# Patient Record
Sex: Female | Born: 1990
Health system: Southern US, Community
[De-identification: ages and names within clinical notes are randomized; demographics above are authoritative.]

## PROBLEM LIST (undated history)

## (undated) DIAGNOSIS — B019 Varicella without complication: Secondary | ICD-10-CM

## (undated) DIAGNOSIS — R112 Nausea with vomiting, unspecified: Secondary | ICD-10-CM

## (undated) DIAGNOSIS — Z9889 Other specified postprocedural states: Secondary | ICD-10-CM

## (undated) DIAGNOSIS — F419 Anxiety disorder, unspecified: Secondary | ICD-10-CM

## (undated) DIAGNOSIS — I10 Essential (primary) hypertension: Secondary | ICD-10-CM

## (undated) DIAGNOSIS — T7840XA Allergy, unspecified, initial encounter: Secondary | ICD-10-CM

## (undated) HISTORY — DX: Anxiety disorder, unspecified: F41.9

## (undated) HISTORY — DX: Essential (primary) hypertension: I10

## (undated) HISTORY — DX: Varicella without complication: B01.9

## (undated) HISTORY — PX: NOSE SURGERY: SHX723

## (undated) HISTORY — PX: COSMETIC SURGERY: SHX468

## (undated) HISTORY — DX: Allergy, unspecified, initial encounter: T78.40XA

---
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
## 2011-08-09 LAB — HM PAP SMEAR

## 2013-08-05 ENCOUNTER — Telehealth: Payer: Self-pay

## 2013-08-05 NOTE — Telephone Encounter (Signed)
Left message for call back Non-identifiable   NEW CLIENT

## 2013-08-08 ENCOUNTER — Encounter: Payer: Self-pay | Admitting: Family Medicine

## 2013-08-08 ENCOUNTER — Ambulatory Visit (INDEPENDENT_AMBULATORY_CARE_PROVIDER_SITE_OTHER): Payer: 59 | Admitting: Family Medicine

## 2013-08-08 VITALS — BP 108/70 | HR 71 | Temp 98.8°F | Ht 65.25 in | Wt 192.2 lb

## 2013-08-08 DIAGNOSIS — E669 Obesity, unspecified: Secondary | ICD-10-CM | POA: Insufficient documentation

## 2013-08-08 DIAGNOSIS — F411 Generalized anxiety disorder: Secondary | ICD-10-CM

## 2013-08-08 NOTE — Patient Instructions (Signed)

## 2013-08-08 NOTE — Progress Notes (Signed)
  Subjective:     Susan FredricksonHayley Craig is a 23 y.o. female who presents for follow up of anxiety disorder. Current symptoms: none. She denies current suicidal and homicidal ideation. She complains of the following side effects from the treatment: none.  The following portions of the patient's history were reviewed and updated as appropriate: allergies, current medications, past family history, past medical history, past social history, past surgical history and problem list.    Past Medical History  Diagnosis Date  . Chicken pox   . Anxiety    Family History  Problem Relation Age of Onset  . Arthritis Paternal Grandmother   . Depression Paternal Grandmother   . Cancer Paternal Grandmother     pancreatic  . Lung cancer Maternal Grandmother     Smoker  . High blood pressure Paternal Grandfather   . Heart disease Paternal Grandfather     a fib-- cabg  . Depression Father   . Anxiety disorder Mother   . Anxiety disorder Sister   . COPD Maternal Grandfather    History   Social History  . Marital Status: Unknown    Spouse Name: N/A    Number of Children: N/A  . Years of Education: N/A   Occupational History  . nurse [employer]   Social History Main Topics  . Smoking status: Never Smoker   . Smokeless tobacco: Never Used  . Alcohol Use: Yes     Comment: Occasionally  . Drug Use: No  . Sexual Activity: Yes    Partners: Male    Birth Control/ Protection: Pill   Other Topics Concern  . Not on file   Social History Narrative  . No narrative on file   Current Outpatient Prescriptions  Medication Sig Dispense Refill  . levonorgestrel-ethinyl estradiol (AVIANE,ALESSE,LESSINA) 0.1-20 MG-MCG tablet Take 1 tablet by mouth daily.      Marland Kitchen. PARoxetine (PAXIL) 20 MG tablet Take 20 mg by mouth daily.       No current facility-administered medications for this visit.    Objective:    BP 108/70  Pulse 71  Temp(Src) 98.8 F (37.1 C) (Oral)  Ht 5' 5.25" (1.657 m)  Wt 192 lb 3.2 oz  (87.181 kg)  BMI 31.75 kg/m2  SpO2 98%  LMP 07/27/2013  General:  alert, cooperative, appears stated age and no distress  Affect/Behavior:  full facial expressions, good grooming, good insight, normal perception, normal reasoning, normal speech pattern and content and normal thought patterns no abnormal findings      Assessment:    Anxiety Disorder - improving    Plan:    Medications: Paxil. Handouts describing disease, natural history, and treatment were given to the patient. Follow up: 1 year or sooner prn.

## 2013-08-08 NOTE — Progress Notes (Signed)
Pre visit review using our clinic review tool, if applicable. No additional management support is needed unless otherwise documented below in the visit note. 

## 2013-08-10 NOTE — Telephone Encounter (Signed)
Unable to reach pre visit.  

## 2013-08-19 ENCOUNTER — Telehealth: Payer: Self-pay | Admitting: Family Medicine

## 2013-08-19 MED ORDER — PAROXETINE HCL 20 MG PO TABS: 20.0000 mg | ORAL_TABLET | Freq: Every day | ORAL | Status: DC

## 2013-08-19 NOTE — Telephone Encounter (Signed)
Rx sent---- Msg left on VM advising the patient  KP

## 2013-08-19 NOTE — Telephone Encounter (Signed)
Patient called and requested a refill for PARoxetine (PAXIL) 20 MG tablet Pharmacy [facility] out patient [facility]

## 2014-06-22 ENCOUNTER — Ambulatory Visit: Payer: 59 | Admitting: Family Medicine

## 2014-06-23 ENCOUNTER — Other Ambulatory Visit: Payer: Self-pay

## 2014-06-23 ENCOUNTER — Ambulatory Visit (INDEPENDENT_AMBULATORY_CARE_PROVIDER_SITE_OTHER): Payer: 59 | Admitting: Family Medicine

## 2014-06-23 DIAGNOSIS — F419 Anxiety disorder, unspecified: Secondary | ICD-10-CM

## 2014-06-23 MED ORDER — LEVONORGESTREL-ETHINYL ESTRAD 0.1-20 MG-MCG PO TABS: 1.0000 | ORAL_TABLET | Freq: Every day | ORAL | Status: DC

## 2014-06-23 NOTE — Progress Notes (Signed)
Pre visit review using our clinic review tool, if applicable. No additional management support is needed unless otherwise documented below in the visit.  

## 2014-08-09 ENCOUNTER — Ambulatory Visit: Payer: 59 | Admitting: Family Medicine

## 2014-09-07 ENCOUNTER — Other Ambulatory Visit: Payer: Self-pay | Admitting: Family Medicine

## 2014-09-07 MED ORDER — PAROXETINE HCL 20 MG PO TABS: 20.0000 mg | ORAL_TABLET | Freq: Every day | ORAL | Status: DC

## 2014-09-07 MED ORDER — LEVONORGESTREL-ETHINYL ESTRAD 0.1-20 MG-MCG PO TABS: 1.0000 | ORAL_TABLET | Freq: Every day | ORAL | Status: DC

## 2014-09-07 NOTE — Addendum Note (Signed)
Addended by: Arnette NorrisPAYNE, Oshae Simmering P on: 09/07/2014 08:21 AM   Modules accepted: Orders

## 2014-09-12 ENCOUNTER — Other Ambulatory Visit (HOSPITAL_COMMUNITY)
Admission: RE | Admit: 2014-09-12 | Discharge: 2014-09-12 | Disposition: A | Discharge status: Home / Self Care | Payer: 59 | Source: Ambulatory Visit | Attending: Family Medicine | Admitting: Family Medicine

## 2014-09-12 ENCOUNTER — Encounter: Payer: Self-pay | Admitting: Family Medicine

## 2014-09-12 ENCOUNTER — Ambulatory Visit (INDEPENDENT_AMBULATORY_CARE_PROVIDER_SITE_OTHER): Payer: 59 | Admitting: Family Medicine

## 2014-09-12 VITALS — HR 80 | Temp 97.9°F | Ht 65.0 in | Wt 202.4 lb

## 2014-09-12 DIAGNOSIS — Z0000 Encounter for general adult medical examination without abnormal findings: Secondary | ICD-10-CM

## 2014-09-12 DIAGNOSIS — Z01419 Encounter for gynecological examination (general) (routine) without abnormal findings: Secondary | ICD-10-CM | POA: Insufficient documentation

## 2014-09-12 LAB — CBC WITH DIFFERENTIAL/PLATELET
BASOS ABS: 0 10*3/uL (ref 0.0–0.1)
BASOS PCT: 0.3 % (ref 0.0–3.0)
Eosinophils Absolute: 0.1 10*3/uL (ref 0.0–0.7)
Eosinophils Relative: 0.8 % (ref 0.0–5.0)
HCT: 41.6 % (ref 36.0–46.0)
Hemoglobin: 14.3 g/dL (ref 12.0–15.0)
LYMPHS PCT: 32 % (ref 12.0–46.0)
Lymphs Abs: 2.9 10*3/uL (ref 0.7–4.0)
MCHC: 34.5 g/dL (ref 30.0–36.0)
MCV: 90.6 fl (ref 78.0–100.0)
Monocytes Absolute: 0.5 10*3/uL (ref 0.1–1.0)
Monocytes Relative: 5.7 % (ref 3.0–12.0)
NEUTROS PCT: 61.2 % (ref 43.0–77.0)
Neutro Abs: 5.5 10*3/uL (ref 1.4–7.7)
PLATELETS: 365 10*3/uL (ref 150.0–400.0)
RBC: 4.59 Mil/uL (ref 3.87–5.11)
RDW: 12.6 % (ref 11.5–15.5)
WBC: 8.9 10*3/uL (ref 4.0–10.5)

## 2014-09-12 LAB — POCT URINALYSIS DIPSTICK
Bilirubin, UA: NEGATIVE
Blood, UA: NEGATIVE
GLUCOSE UA: NEGATIVE
KETONES UA: NEGATIVE
Leukocytes, UA: NEGATIVE
Nitrite, UA: NEGATIVE
PROTEIN UA: NEGATIVE
SPEC GRAV UA: 1.025
Urobilinogen, UA: 4
pH, UA: 6

## 2014-09-12 LAB — BASIC METABOLIC PANEL
BUN: 13 mg/dL (ref 6–23)
CALCIUM: 9.7 mg/dL (ref 8.4–10.5)
CO2: 25 mEq/L (ref 19–32)
Chloride: 106 mEq/L (ref 96–112)
Creatinine, Ser: 0.81 mg/dL (ref 0.40–1.20)
GFR: 92.07 mL/min (ref >60.00)
Glucose, Bld: 88 mg/dL (ref 70–99)
POTASSIUM: 3.7 meq/L (ref 3.5–5.1)
Sodium: 138 mEq/L (ref 135–145)

## 2014-09-12 LAB — LIPID PANEL
Cholesterol: 194 mg/dL (ref 0–200)
HDL: 56.1 mg/dL (ref >39.00)
LDL Cholesterol: 116 mg/dL — ABNORMAL HIGH (ref 0–99)
NonHDL: 137.9
TRIGLYCERIDES: 108 mg/dL (ref 0.0–149.0)
Total CHOL/HDL Ratio: 3
VLDL: 21.6 mg/dL (ref 0.0–40.0)

## 2014-09-12 LAB — TSH: TSH: 1.17 u[IU]/mL (ref 0.35–4.50)

## 2014-09-12 NOTE — Progress Notes (Signed)
Subjective:     Susan Craig is a 24 y.o. female and is here for a comprehensive physical exam. The patient reports no problems.  History   Social History  . Marital Status: Single    Spouse Name: N/A  . Number of Children: N/A  . Years of Education: N/A   Occupational History  . nurse [employer]   Social History Main Topics  . Smoking status: Never Smoker   . Smokeless tobacco: Never Used  . Alcohol Use: Yes     Comment: Occasionally  . Drug Use: No  . Sexual Activity:    Partners: Male    Birth Control/ Protection: Pill   Other Topics Concern  . Not on file   Social History Narrative   Health Maintenance  Topic Date Due  . PAP SMEAR  08/09/2014  . HIV Screening  09/12/2015 (Originally 05/14/2005)  . INFLUENZA VACCINE  12/11/2014  . TETANUS/TDAP  12/11/2022    The following portions of the patient's history were reviewed and updated as appropriate:  She  has a past medical history of Chicken pox and Anxiety. She  does not have any pertinent problems on file. She  has past surgical history that includes Tonsillectomy (2009) and Nose surgery. Her family history includes Anxiety disorder in her mother and sister; Arthritis in her paternal grandmother; COPD in her maternal grandfather; Cancer in her paternal grandmother; Depression in her father and paternal grandmother; Heart disease in her paternal grandfather; High blood pressure in her paternal grandfather; Lung cancer in her maternal grandmother. She  reports that she has never smoked. She has never used smokeless tobacco. She reports that she drinks alcohol. She reports that she does not use illicit drugs. She has a current medication list which includes the following prescription(s): levonorgestrel-ethinyl estradiol and paroxetine. Current Outpatient Prescriptions on File Prior to Visit  Medication Sig Dispense Refill  . levonorgestrel-ethinyl estradiol (AVIANE,ALESSE,LESSINA) 0.1-20 MG-MCG tablet Take 1 tablet  by mouth daily. 3 Package 4  . PARoxetine (PAXIL) 20 MG tablet Take 1 tablet (20 mg total) by mouth daily. 90 tablet 1   No current facility-administered medications on file prior to visit.   She has No Known Allergies..  Review of Systems Review of Systems  Constitutional: Negative for activity change, appetite change and fatigue.  HENT: Negative for hearing loss, congestion, tinnitus and ear discharge.  dentist [id number] Eyes: Negative for visual disturbance (see optho-- due_ Respiratory: Negative for cough, chest tightness and shortness of breath.   Cardiovascular: Negative for chest pain, palpitations and leg swelling.  Gastrointestinal: Negative for abdominal pain, diarrhea, constipation and abdominal distention.  Genitourinary: Negative for urgency, frequency, decreased urine volume and difficulty urinating.  Musculoskeletal: Negative for back pain, arthralgias and gait problem.  Skin: Negative for color change, pallor and rash.  Neurological: Negative for dizziness, light-headedness, numbness and headaches.  Hematological: Negative for adenopathy. Does not bruise/bleed easily.  Psychiatric/Behavioral: Negative for suicidal ideas, confusion, sleep disturbance, self-injury, dysphoric mood, decreased concentration and agitation.       Objective:    Pulse 80  Temp(Src) 97.9 F (36.6 C) (Oral)  Ht 5\' 5"  (1.651 m)  Wt 202 lb 6.4 oz (91.808 kg)  BMI 33.68 kg/m2  SpO2 98%  LMP 08/13/2014 General appearance: alert, cooperative, appears stated age and no distress Head: Normocephalic, without obvious abnormality, atraumatic Eyes: conjunctivae/corneas clear. PERRL, EOM's intact. Fundi benign. Ears: normal TM's and external ear canals both ears Nose: Nares normal. Septum midline. Mucosa normal. No drainage or  sinus tenderness. Throat: lips, mucosa, and tongue normal; teeth and gums normal Neck: no adenopathy, no carotid bruit, no JVD, supple, symmetrical, trachea midline and thyroid not  enlarged, symmetric, no tenderness/mass/nodules Back: symmetric, no curvature. ROM normal. No CVA tenderness. Lungs: clear to auscultation bilaterally Breasts: normal appearance, no masses or tenderness Heart: regular rate and rhythm, S1, S2 normal, no murmur, click, rub or gallop Abdomen: soft, non-tender; bowel sounds normal; no masses,  no organomegaly Pelvic: cervix normal in appearance, external genitalia normal, no adnexal masses or tenderness, no cervical motion tenderness, rectovaginal septum normal, uterus normal size, shape, and consistency, vagina normal without discharge and pap done Extremities: extremities normal, atraumatic, no cyanosis or edema Pulses: 2+ and symmetric Skin: Skin color, texture, turgor normal. No rashes or lesions Lymph nodes: Cervical, supraclavicular, and axillary nodes normal. Neurologic: Alert and oriented X 3, normal strength and tone. Normal symmetric reflexes. Normal coordination and gait Psych- no depression, no anxiety      Assessment:    Healthy female exam.     Plan:    check labs  Start PNV Consider weaning off paxil See After Visit Summary for Counseling Recommendations   1. Preventative health care  - Cytology - PAP - Basic Metabolic Panel (BMET) - TSH - CBC with Differential - Lipid Profile - POCT Urinalysis Dipstick

## 2014-09-12 NOTE — Progress Notes (Signed)
Pre visit review using our clinic review tool, if applicable. No additional management support is needed unless otherwise documented below in the visit note. 

## 2014-09-12 NOTE — Patient Instructions (Signed)
Preventive Care for Adults A healthy lifestyle and preventive care can promote health and wellness. Preventive health guidelines for women include the following key practices.  A routine yearly physical is a good way to check with your health care provider about your health and preventive screening. It is a chance to share any concerns and updates on your health and to receive a thorough exam.  Visit your dentist for a routine exam and preventive care every 6 months. Brush your teeth twice a day and floss once a day. Good oral hygiene prevents tooth decay and gum disease.  The frequency of eye exams is based on your age, health, family medical history, use of contact lenses, and other factors. Follow your health care provider's recommendations for frequency of eye exams.  Eat a healthy diet. Foods like vegetables, fruits, whole grains, low-fat dairy products, and lean protein foods contain the nutrients you need without too many calories. Decrease your intake of foods high in solid fats, added sugars, and salt. Eat the right amount of calories for you.Get information about a proper diet from your health care provider, if necessary.  Regular physical exercise is one of the most important things you can do for your health. Most adults should get at least 150 minutes of moderate-intensity exercise (any activity that increases your heart rate and causes you to sweat) each week. In addition, most adults need muscle-strengthening exercises on 2 or more days a week.  Maintain a healthy weight. The body mass index (BMI) is a screening tool to identify possible weight problems. It provides an estimate of body fat based on height and weight. Your health care provider can find your BMI and can help you achieve or maintain a healthy weight.For adults 20 years and older:  A BMI below 18.5 is considered underweight.  A BMI of 18.5 to 24.9 is normal.  A BMI of 25 to 29.9 is considered overweight.  A BMI of  30 and above is considered obese.  Maintain normal blood lipids and cholesterol levels by exercising and minimizing your intake of saturated fat. Eat a balanced diet with plenty of fruit and vegetables. Blood tests for lipids and cholesterol should begin at age 76 and be repeated every 5 years. If your lipid or cholesterol levels are high, you are over 50, or you are at high risk for heart disease, you may need your cholesterol levels checked more frequently.Ongoing high lipid and cholesterol levels should be treated with medicines if diet and exercise are not working.  If you smoke, find out from your health care provider how to quit. If you do not use tobacco, do not start.  Lung cancer screening is recommended for adults aged 22-80 years who are at high risk for developing lung cancer because of a history of smoking. A yearly low-dose CT scan of the lungs is recommended for people who have at least a 30-pack-year history of smoking and are a current smoker or have quit within the past 15 years. A pack year of smoking is smoking an average of 1 pack of cigarettes a day for 1 year (for example: 1 pack a day for 30 years or 2 packs a day for 15 years). Yearly screening should continue until the smoker has stopped smoking for at least 15 years. Yearly screening should be stopped for people who develop a health problem that would prevent them from having lung cancer treatment.  If you are pregnant, do not drink alcohol. If you are breastfeeding,  be very cautious about drinking alcohol. If you are not pregnant and choose to drink alcohol, do not have more than 1 drink per day. One drink is considered to be 12 ounces (355 mL) of beer, 5 ounces (148 mL) of wine, or 1.5 ounces (44 mL) of liquor.  Avoid use of street drugs. Do not share needles with anyone. Ask for help if you need support or instructions about stopping the use of drugs.  High blood pressure causes heart disease and increases the risk of  stroke. Your blood pressure should be checked at least every 1 to 2 years. Ongoing high blood pressure should be treated with medicines if weight loss and exercise do not work.  If you are 75-52 years old, ask your health care provider if you should take aspirin to prevent strokes.  Diabetes screening involves taking a blood sample to check your fasting blood sugar level. This should be done once every 3 years, after age 15, if you are within normal weight and without risk factors for diabetes. Testing should be considered at a younger age or be carried out more frequently if you are overweight and have at least 1 risk factor for diabetes.  Breast cancer screening is essential preventive care for women. You should practice "breast self-awareness." This means understanding the normal appearance and feel of your breasts and may include breast self-examination. Any changes detected, no matter how small, should be reported to a health care provider. Women in their 58s and 30s should have a clinical breast exam (CBE) by a health care provider as part of a regular health exam every 1 to 3 years. After age 16, women should have a CBE every year. Starting at age 53, women should consider having a mammogram (breast X-ray test) every year. Women who have a family history of breast cancer should talk to their health care provider about genetic screening. Women at a high risk of breast cancer should talk to their health care providers about having an MRI and a mammogram every year.  Breast cancer gene (BRCA)-related cancer risk assessment is recommended for women who have family members with BRCA-related cancers. BRCA-related cancers include breast, ovarian, tubal, and peritoneal cancers. Having family members with these cancers may be associated with an increased risk for harmful changes (mutations) in the breast cancer genes BRCA1 and BRCA2. Results of the assessment will determine the need for genetic counseling and  BRCA1 and BRCA2 testing.  Routine pelvic exams to screen for cancer are no longer recommended for nonpregnant women who are considered low risk for cancer of the pelvic organs (ovaries, uterus, and vagina) and who do not have symptoms. Ask your health care provider if a screening pelvic exam is right for you.  If you have had past treatment for cervical cancer or a condition that could lead to cancer, you need Pap tests and screening for cancer for at least 20 years after your treatment. If Pap tests have been discontinued, your risk factors (such as having a new sexual partner) need to be reassessed to determine if screening should be resumed. Some women have medical problems that increase the chance of getting cervical cancer. In these cases, your health care provider may recommend more frequent screening and Pap tests.  The HPV test is an additional test that may be used for cervical cancer screening. The HPV test looks for the virus that can cause the cell changes on the cervix. The cells collected during the Pap test can be  tested for HPV. The HPV test could be used to screen women aged 30 years and older, and should be used in women of any age who have unclear Pap test results. After the age of 30, women should have HPV testing at the same frequency as a Pap test.  Colorectal cancer can be detected and often prevented. Most routine colorectal cancer screening begins at the age of 50 years and continues through age 75 years. However, your health care provider may recommend screening at an earlier age if you have risk factors for colon cancer. On a yearly basis, your health care provider may provide home test kits to check for hidden blood in the stool. Use of a small camera at the end of a tube, to directly examine the colon (sigmoidoscopy or colonoscopy), can detect the earliest forms of colorectal cancer. Talk to your health care provider about this at age 50, when routine screening begins. Direct  exam of the colon should be repeated every 5-10 years through age 75 years, unless early forms of pre-cancerous polyps or small growths are found.  People who are at an increased risk for hepatitis B should be screened for this virus. You are considered at high risk for hepatitis B if:  You were born in a country where hepatitis B occurs often. Talk with your health care provider about which countries are considered high risk.  Your parents were born in a high-risk country and you have not received a shot to protect against hepatitis B (hepatitis B vaccine).  You have HIV or AIDS.  You use needles to inject street drugs.  You live with, or have sex with, someone who has hepatitis B.  You get hemodialysis treatment.  You take certain medicines for conditions like cancer, organ transplantation, and autoimmune conditions.  Hepatitis C blood testing is recommended for all people born from 1945 through 1965 and any individual with known risks for hepatitis C.  Practice safe sex. Use condoms and avoid high-risk sexual practices to reduce the spread of sexually transmitted infections (STIs). STIs include gonorrhea, chlamydia, syphilis, trichomonas, herpes, HPV, and human immunodeficiency virus (HIV). Herpes, HIV, and HPV are viral illnesses that have no cure. They can result in disability, cancer, and death.  You should be screened for sexually transmitted illnesses (STIs) including gonorrhea and chlamydia if:  You are sexually active and are younger than 24 years.  You are older than 24 years and your health care provider tells you that you are at risk for this type of infection.  Your sexual activity has changed since you were last screened and you are at an increased risk for chlamydia or gonorrhea. Ask your health care provider if you are at risk.  If you are at risk of being infected with HIV, it is recommended that you take a prescription medicine daily to prevent HIV infection. This is  called preexposure prophylaxis (PrEP). You are considered at risk if:  You are a heterosexual woman, are sexually active, and are at increased risk for HIV infection.  You take drugs by injection.  You are sexually active with a partner who has HIV.  Talk with your health care provider about whether you are at high risk of being infected with HIV. If you choose to begin PrEP, you should first be tested for HIV. You should then be tested every 3 months for as long as you are taking PrEP.  Osteoporosis is a disease in which the bones lose minerals and strength   with aging. This can result in serious bone fractures or breaks. The risk of osteoporosis can be identified using a bone density scan. Women ages 65 years and over and women at risk for fractures or osteoporosis should discuss screening with their health care providers. Ask your health care provider whether you should take a calcium supplement or vitamin D to reduce the rate of osteoporosis.  Menopause can be associated with physical symptoms and risks. Hormone replacement therapy is available to decrease symptoms and risks. You should talk to your health care provider about whether hormone replacement therapy is right for you.  Use sunscreen. Apply sunscreen liberally and repeatedly throughout the day. You should seek shade when your shadow is shorter than you. Protect yourself by wearing long sleeves, pants, a wide-brimmed hat, and sunglasses year round, whenever you are outdoors.  Once a month, do a whole body skin exam, using a mirror to look at the skin on your back. Tell your health care provider of new moles, moles that have irregular borders, moles that are larger than a pencil eraser, or moles that have changed in shape or color.  Stay current with required vaccines (immunizations).  Influenza vaccine. All adults should be immunized every year.  Tetanus, diphtheria, and acellular pertussis (Td, Tdap) vaccine. Pregnant women should  receive 1 dose of Tdap vaccine during each pregnancy. The dose should be obtained regardless of the length of time since the last dose. Immunization is preferred during the 27th-36th week of gestation. An adult who has not previously received Tdap or who does not know her vaccine status should receive 1 dose of Tdap. This initial dose should be followed by tetanus and diphtheria toxoids (Td) booster doses every 10 years. Adults with an unknown or incomplete history of completing a 3-dose immunization series with Td-containing vaccines should begin or complete a primary immunization series including a Tdap dose. Adults should receive a Td booster every 10 years.  Varicella vaccine. An adult without evidence of immunity to varicella should receive 2 doses or a second dose if she has previously received 1 dose. Pregnant females who do not have evidence of immunity should receive the first dose after pregnancy. This first dose should be obtained before leaving the health care facility. The second dose should be obtained 4-8 weeks after the first dose.  Human papillomavirus (HPV) vaccine. Females aged 13-26 years who have not received the vaccine previously should obtain the 3-dose series. The vaccine is not recommended for use in pregnant females. However, pregnancy testing is not needed before receiving a dose. If a female is found to be pregnant after receiving a dose, no treatment is needed. In that case, the remaining doses should be delayed until after the pregnancy. Immunization is recommended for any person with an immunocompromised condition through the age of 26 years if she did not get any or all doses earlier. During the 3-dose series, the second dose should be obtained 4-8 weeks after the first dose. The third dose should be obtained 24 weeks after the first dose and 16 weeks after the second dose.  Zoster vaccine. One dose is recommended for adults aged 60 years or older unless certain conditions are  present.  Measles, mumps, and rubella (MMR) vaccine. Adults born before 1957 generally are considered immune to measles and mumps. Adults born in 1957 or later should have 1 or more doses of MMR vaccine unless there is a contraindication to the vaccine or there is laboratory evidence of immunity to   each of the three diseases. A routine second dose of MMR vaccine should be obtained at least 28 days after the first dose for students attending postsecondary schools, health care workers, or international travelers. People who received inactivated measles vaccine or an unknown type of measles vaccine during 1963-1967 should receive 2 doses of MMR vaccine. People who received inactivated mumps vaccine or an unknown type of mumps vaccine before 1979 and are at high risk for mumps infection should consider immunization with 2 doses of MMR vaccine. For females of childbearing age, rubella immunity should be determined. If there is no evidence of immunity, females who are not pregnant should be vaccinated. If there is no evidence of immunity, females who are pregnant should delay immunization until after pregnancy. Unvaccinated health care workers born before 1957 who lack laboratory evidence of measles, mumps, or rubella immunity or laboratory confirmation of disease should consider measles and mumps immunization with 2 doses of MMR vaccine or rubella immunization with 1 dose of MMR vaccine.  Pneumococcal 13-valent conjugate (PCV13) vaccine. When indicated, a person who is uncertain of her immunization history and has no record of immunization should receive the PCV13 vaccine. An adult aged 19 years or older who has certain medical conditions and has not been previously immunized should receive 1 dose of PCV13 vaccine. This PCV13 should be followed with a dose of pneumococcal polysaccharide (PPSV23) vaccine. The PPSV23 vaccine dose should be obtained at least 8 weeks after the dose of PCV13 vaccine. An adult aged 19  years or older who has certain medical conditions and previously received 1 or more doses of PPSV23 vaccine should receive 1 dose of PCV13. The PCV13 vaccine dose should be obtained 1 or more years after the last PPSV23 vaccine dose.  Pneumococcal polysaccharide (PPSV23) vaccine. When PCV13 is also indicated, PCV13 should be obtained first. All adults aged 65 years and older should be immunized. An adult younger than age 65 years who has certain medical conditions should be immunized. Any person who resides in a nursing home or long-term care facility should be immunized. An adult smoker should be immunized. People with an immunocompromised condition and certain other conditions should receive both PCV13 and PPSV23 vaccines. People with human immunodeficiency virus (HIV) infection should be immunized as soon as possible after diagnosis. Immunization during chemotherapy or radiation therapy should be avoided. Routine use of PPSV23 vaccine is not recommended for American Indians, [place] Natives, or people younger than 65 years unless there are medical conditions that require PPSV23 vaccine. When indicated, people who have unknown immunization and have no record of immunization should receive PPSV23 vaccine. One-time revaccination 5 years after the first dose of PPSV23 is recommended for people aged 19-64 years who have chronic kidney failure, nephrotic syndrome, asplenia, or immunocompromised conditions. People who received 1-2 doses of PPSV23 before age 65 years should receive another dose of PPSV23 vaccine at age 65 years or later if at least 5 years have passed since the previous dose. Doses of PPSV23 are not needed for people immunized with PPSV23 at or after age 65 years.  Meningococcal vaccine. Adults with asplenia or persistent complement component deficiencies should receive 2 doses of quadrivalent meningococcal conjugate (MenACWY-D) vaccine. The doses should be obtained at least 2 months apart.  Microbiologists working with certain meningococcal bacteria, military recruits, people at risk during an outbreak, and people who travel to or live in countries with a high rate of meningitis should be immunized. A first-year college student up through age   21 years who is living in a residence hall should receive a dose if she did not receive a dose on or after her 16th birthday. Adults who have certain high-risk conditions should receive one or more doses of vaccine.  Hepatitis A vaccine. Adults who wish to be protected from this disease, have certain high-risk conditions, work with hepatitis A-infected animals, work in hepatitis A research labs, or travel to or work in countries with a high rate of hepatitis A should be immunized. Adults who were previously unvaccinated and who anticipate close contact with an international adoptee during the first 60 days after arrival in the [place] from a country with a high rate of hepatitis A should be immunized.  Hepatitis B vaccine. Adults who wish to be protected from this disease, have certain high-risk conditions, may be exposed to blood or other infectious body fluids, are household contacts or sex partners of hepatitis B positive people, are clients or workers in certain care facilities, or travel to or work in countries with a high rate of hepatitis B should be immunized.  Haemophilus influenzae type b (Hib) vaccine. A previously unvaccinated person with asplenia or sickle cell disease or having a scheduled splenectomy should receive 1 dose of Hib vaccine. Regardless of previous immunization, a recipient of a hematopoietic stem cell transplant should receive a 3-dose series 6-12 months after her successful transplant. Hib vaccine is not recommended for adults with HIV infection. Preventive Services / Frequency Ages 64 to 68 years  Blood pressure check.** / Every 1 to 2 years.  Lipid and cholesterol check.** / Every 5 years beginning at age  22.  Clinical breast exam.** / Every 3 years for women in their 88s and 53s.  BRCA-related cancer risk assessment.** / For women who have family members with a BRCA-related cancer (breast, ovarian, tubal, or peritoneal cancers).  Pap test.** / Every 2 years from ages 90 through 51. Every 3 years starting at age 21 through age 56 or 3 with a history of 3 consecutive normal Pap tests.  HPV screening.** / Every 3 years from ages 24 through ages 1 to 46 with a history of 3 consecutive normal Pap tests.  Hepatitis C blood test.** / For any individual with known risks for hepatitis C.  Skin self-exam. / Monthly.  Influenza vaccine. / Every year.  Tetanus, diphtheria, and acellular pertussis (Tdap, Td) vaccine.** / Consult your health care provider. Pregnant women should receive 1 dose of Tdap vaccine during each pregnancy. 1 dose of Td every 10 years.  Varicella vaccine.** / Consult your health care provider. Pregnant females who do not have evidence of immunity should receive the first dose after pregnancy.  HPV vaccine. / 3 doses over 6 months, if 72 and younger. The vaccine is not recommended for use in pregnant females. However, pregnancy testing is not needed before receiving a dose.  Measles, mumps, rubella (MMR) vaccine.** / You need at least 1 dose of MMR if you were born in 1957 or later. You may also need a 2nd dose. For females of childbearing age, rubella immunity should be determined. If there is no evidence of immunity, females who are not pregnant should be vaccinated. If there is no evidence of immunity, females who are pregnant should delay immunization until after pregnancy.  Pneumococcal 13-valent conjugate (PCV13) vaccine.** / Consult your health care provider.  Pneumococcal polysaccharide (PPSV23) vaccine.** / 1 to 2 doses if you smoke cigarettes or if you have certain conditions.  Meningococcal vaccine.** /  1 dose if you are age 19 to 21 years and a first-year college  student living in a residence hall, or have one of several medical conditions, you need to get vaccinated against meningococcal disease. You may also need additional booster doses.  Hepatitis A vaccine.** / Consult your health care provider.  Hepatitis B vaccine.** / Consult your health care provider.  Haemophilus influenzae type b (Hib) vaccine.** / Consult your health care provider. Ages 40 to 64 years  Blood pressure check.** / Every 1 to 2 years.  Lipid and cholesterol check.** / Every 5 years beginning at age 20 years.  Lung cancer screening. / Every year if you are aged 55-80 years and have a 30-pack-year history of smoking and currently smoke or have quit within the past 15 years. Yearly screening is stopped once you have quit smoking for at least 15 years or develop a health problem that would prevent you from having lung cancer treatment.  Clinical breast exam.** / Every year after age 40 years.  BRCA-related cancer risk assessment.** / For women who have family members with a BRCA-related cancer (breast, ovarian, tubal, or peritoneal cancers).  Mammogram.** / Every year beginning at age 40 years and continuing for as long as you are in good health. Consult with your health care provider.  Pap test.** / Every 3 years starting at age 30 years through age 65 or 70 years with a history of 3 consecutive normal Pap tests.  HPV screening.** / Every 3 years from ages 30 years through ages 65 to 70 years with a history of 3 consecutive normal Pap tests.  Fecal occult blood test (FOBT) of stool. / Every year beginning at age 50 years and continuing until age 75 years. You may not need to do this test if you get a colonoscopy every 10 years.  Flexible sigmoidoscopy or colonoscopy.** / Every 5 years for a flexible sigmoidoscopy or every 10 years for a colonoscopy beginning at age 50 years and continuing until age 75 years.  Hepatitis C blood test.** / For all people born from 1945 through  1965 and any individual with known risks for hepatitis C.  Skin self-exam. / Monthly.  Influenza vaccine. / Every year.  Tetanus, diphtheria, and acellular pertussis (Tdap/Td) vaccine.** / Consult your health care provider. Pregnant women should receive 1 dose of Tdap vaccine during each pregnancy. 1 dose of Td every 10 years.  Varicella vaccine.** / Consult your health care provider. Pregnant females who do not have evidence of immunity should receive the first dose after pregnancy.  Zoster vaccine.** / 1 dose for adults aged 60 years or older.  Measles, mumps, rubella (MMR) vaccine.** / You need at least 1 dose of MMR if you were born in 1957 or later. You may also need a 2nd dose. For females of childbearing age, rubella immunity should be determined. If there is no evidence of immunity, females who are not pregnant should be vaccinated. If there is no evidence of immunity, females who are pregnant should delay immunization until after pregnancy.  Pneumococcal 13-valent conjugate (PCV13) vaccine.** / Consult your health care provider.  Pneumococcal polysaccharide (PPSV23) vaccine.** / 1 to 2 doses if you smoke cigarettes or if you have certain conditions.  Meningococcal vaccine.** / Consult your health care provider.  Hepatitis A vaccine.** / Consult your health care provider.  Hepatitis B vaccine.** / Consult your health care provider.  Haemophilus influenzae type b (Hib) vaccine.** / Consult your health care provider. Ages 65   years and over  Blood pressure check.** / Every 1 to 2 years.  Lipid and cholesterol check.** / Every 5 years beginning at age 22 years.  Lung cancer screening. / Every year if you are aged 73-80 years and have a 30-pack-year history of smoking and currently smoke or have quit within the past 15 years. Yearly screening is stopped once you have quit smoking for at least 15 years or develop a health problem that would prevent you from having lung cancer  treatment.  Clinical breast exam.** / Every year after age 4 years.  BRCA-related cancer risk assessment.** / For women who have family members with a BRCA-related cancer (breast, ovarian, tubal, or peritoneal cancers).  Mammogram.** / Every year beginning at age 40 years and continuing for as long as you are in good health. Consult with your health care provider.  Pap test.** / Every 3 years starting at age 9 years through age 34 or 91 years with 3 consecutive normal Pap tests. Testing can be stopped between 65 and 70 years with 3 consecutive normal Pap tests and no abnormal Pap or HPV tests in the past 10 years.  HPV screening.** / Every 3 years from ages 57 years through ages 64 or 45 years with a history of 3 consecutive normal Pap tests. Testing can be stopped between 65 and 70 years with 3 consecutive normal Pap tests and no abnormal Pap or HPV tests in the past 10 years.  Fecal occult blood test (FOBT) of stool. / Every year beginning at age 15 years and continuing until age 17 years. You may not need to do this test if you get a colonoscopy every 10 years.  Flexible sigmoidoscopy or colonoscopy.** / Every 5 years for a flexible sigmoidoscopy or every 10 years for a colonoscopy beginning at age 86 years and continuing until age 71 years.  Hepatitis C blood test.** / For all people born from [birth year] through 1965 and any individual with known risks for hepatitis C.  Osteoporosis screening.** / A one-time screening for women ages 83 years and over and women at risk for fractures or osteoporosis.  Skin self-exam. / Monthly.  Influenza vaccine. / Every year.  Tetanus, diphtheria, and acellular pertussis (Tdap/Td) vaccine.** / 1 dose of Td every 10 years.  Varicella vaccine.** / Consult your health care provider.  Zoster vaccine.** / 1 dose for adults aged 61 years or older.  Pneumococcal 13-valent conjugate (PCV13) vaccine.** / Consult your health care provider.  Pneumococcal  polysaccharide (PPSV23) vaccine.** / 1 dose for all adults aged 28 years and older.  Meningococcal vaccine.** / Consult your health care provider.  Hepatitis A vaccine.** / Consult your health care provider.  Hepatitis B vaccine.** / Consult your health care provider.  Haemophilus influenzae type b (Hib) vaccine.** / Consult your health care provider. ** Family history and personal history of risk and conditions may change your health care provider's recommendations. Document Released: 06/24/2001 Document Revised: 09/12/2013 Document Reviewed: 09/23/2010 [facility] Patient Information 2015 [place], [place]. This information is not intended to replace advice given to you by your health care provider. Make sure you discuss any questions you have with your health care provider.

## 2014-09-14 LAB — CYTOLOGY - PAP

## 2015-03-27 ENCOUNTER — Encounter (HOSPITAL_BASED_OUTPATIENT_CLINIC_OR_DEPARTMENT_OTHER): Payer: Self-pay | Admitting: *Deleted

## 2015-03-27 ENCOUNTER — Telehealth: Payer: Self-pay | Admitting: Family Medicine

## 2015-03-27 ENCOUNTER — Emergency Department (HOSPITAL_BASED_OUTPATIENT_CLINIC_OR_DEPARTMENT_OTHER): Payer: 59

## 2015-03-27 ENCOUNTER — Emergency Department (HOSPITAL_BASED_OUTPATIENT_CLINIC_OR_DEPARTMENT_OTHER)
Admission: EM | Admit: 2015-03-27 | Discharge: 2015-03-27 | Disposition: A | Discharge status: Home / Self Care | Payer: 59 | Attending: Emergency Medicine | Admitting: Emergency Medicine

## 2015-03-27 DIAGNOSIS — Z8619 Personal history of other infectious and parasitic diseases: Secondary | ICD-10-CM | POA: Diagnosis not present

## 2015-03-27 DIAGNOSIS — F419 Anxiety disorder, unspecified: Secondary | ICD-10-CM | POA: Diagnosis not present

## 2015-03-27 DIAGNOSIS — Z79899 Other long term (current) drug therapy: Secondary | ICD-10-CM | POA: Diagnosis not present

## 2015-03-27 DIAGNOSIS — K5900 Constipation, unspecified: Secondary | ICD-10-CM | POA: Insufficient documentation

## 2015-03-27 DIAGNOSIS — R1013 Epigastric pain: Secondary | ICD-10-CM | POA: Diagnosis present

## 2015-03-27 DIAGNOSIS — R110 Nausea: Secondary | ICD-10-CM

## 2015-03-27 DIAGNOSIS — R109 Unspecified abdominal pain: Secondary | ICD-10-CM

## 2015-03-27 DIAGNOSIS — Z3202 Encounter for pregnancy test, result negative: Secondary | ICD-10-CM | POA: Diagnosis not present

## 2015-03-27 LAB — CBC WITH DIFFERENTIAL/PLATELET
BASOS PCT: 0 %
Basophils Absolute: 0 10*3/uL (ref 0.0–0.1)
EOS ABS: 0.1 10*3/uL (ref 0.0–0.7)
Eosinophils Relative: 1 %
HCT: 41.1 % (ref 36.0–46.0)
HEMOGLOBIN: 14 g/dL (ref 12.0–15.0)
LYMPHS ABS: 2 10*3/uL (ref 0.7–4.0)
Lymphocytes Relative: 17 %
MCH: 31 pg (ref 26.0–34.0)
MCHC: 34.1 g/dL (ref 30.0–36.0)
MCV: 90.9 fL (ref 78.0–100.0)
Monocytes Absolute: 0.6 10*3/uL (ref 0.1–1.0)
Monocytes Relative: 5 %
NEUTROS PCT: 77 %
Neutro Abs: 8.9 10*3/uL — ABNORMAL HIGH (ref 1.7–7.7)
Platelets: 342 10*3/uL (ref 150–400)
RBC: 4.52 MIL/uL (ref 3.87–5.11)
RDW: 11.8 % (ref 11.5–15.5)
WBC: 11.6 10*3/uL — AB (ref 4.0–10.5)

## 2015-03-27 LAB — COMPREHENSIVE METABOLIC PANEL
ALK PHOS: 71 U/L (ref 38–126)
ALT: 36 U/L (ref 14–54)
AST: 32 U/L (ref 15–41)
Albumin: 4.2 g/dL (ref 3.5–5.0)
Anion gap: 7 (ref 5–15)
BUN: 19 mg/dL (ref 6–20)
CALCIUM: 9.3 mg/dL (ref 8.9–10.3)
CHLORIDE: 106 mmol/L (ref 101–111)
CO2: 25 mmol/L (ref 22–32)
CREATININE: 0.64 mg/dL (ref 0.44–1.00)
Glucose, Bld: 113 mg/dL — ABNORMAL HIGH (ref 65–99)
Potassium: 4 mmol/L (ref 3.5–5.1)
SODIUM: 138 mmol/L (ref 135–145)
Total Bilirubin: 0.7 mg/dL (ref 0.3–1.2)
Total Protein: 7.3 g/dL (ref 6.5–8.1)

## 2015-03-27 LAB — URINALYSIS, ROUTINE W REFLEX MICROSCOPIC
Bilirubin Urine: NEGATIVE
Glucose, UA: NEGATIVE mg/dL
Hgb urine dipstick: NEGATIVE
Ketones, ur: NEGATIVE mg/dL
Leukocytes, UA: NEGATIVE
NITRITE: NEGATIVE
Protein, ur: NEGATIVE mg/dL
SPECIFIC GRAVITY, URINE: 1.015 (ref 1.005–1.030)
Urobilinogen, UA: 0.2 mg/dL (ref 0.0–1.0)
pH: 7 (ref 5.0–8.0)

## 2015-03-27 LAB — PREGNANCY, URINE: Preg Test, Ur: NEGATIVE

## 2015-03-27 LAB — LIPASE, BLOOD: LIPASE: 28 U/L (ref 11–51)

## 2015-03-27 MED ORDER — DICYCLOMINE HCL 20 MG PO TABS: 20.0000 mg | ORAL_TABLET | Freq: Two times a day (BID) | ORAL | Status: DC | PRN

## 2015-03-27 MED ORDER — ONDANSETRON 4 MG PO TBDP
4.0000 mg | ORAL_TABLET | Freq: Once | ORAL | Status: AC
  Administered 2015-03-27: 4 mg via ORAL
  Filled 2015-03-27: qty 1

## 2015-03-27 MED ORDER — DICYCLOMINE HCL 10 MG PO CAPS
10.0000 mg | ORAL_CAPSULE | Freq: Once | ORAL | Status: AC
  Administered 2015-03-27: 10 mg via ORAL
  Filled 2015-03-27: qty 1

## 2015-03-27 MED ORDER — ONDANSETRON 4 MG PO TBDP: ORAL_TABLET | ORAL | Status: DC

## 2015-03-27 MED ORDER — GI COCKTAIL ~~LOC~~
30.0000 mL | Freq: Once | ORAL | Status: AC
  Administered 2015-03-27: 30 mL via ORAL
  Filled 2015-03-27: qty 30

## 2015-03-27 MED ORDER — POLYETHYLENE GLYCOL 3350 17 G PO PACK: 17.0000 g | PACK | Freq: Every day | ORAL | Status: DC

## 2015-03-27 NOTE — Telephone Encounter (Signed)
Needs er fu

## 2015-03-27 NOTE — Telephone Encounter (Signed)
My chart msg from pt 03/26/15 for abdominal pain/med change. Looks like pt went to ER yesterday. I left her a msg to call and schedule ER follow up.

## 2015-03-27 NOTE — ED Provider Notes (Signed)
CSN: [id number]     Arrival date & time 03/27/15  0608 History   First MD Initiated Contact with Patient 03/27/15 [phone]     Chief Complaint  Patient presents with  . Abdominal Pain     (Consider location/radiation/quality/duration/timing/severity/associated sxs/prior Treatment) HPI Patient presents with 4 days of episodic epigastric pain. She states there is a dull discomfort in the epigastrium accentuated by brief periods of sharp pain. She also has had nausea without vomiting. Last bowel movement was Saturday. No melena or gross blood. Patient denies any urinary symptoms or flank pain. No history of abdominal surgeries. Questionable whether food may make the pain worse. Denies any fever or chills. Denies frequent NSAID use Past Medical History  Diagnosis Date  . Chicken pox   . Anxiety    Past Surgical History  Procedure Laterality Date  . Tonsillectomy  2009  . Nose surgery      septoplasty x2 and revision x1--- [facility]   Family History  Problem Relation Age of Onset  . Arthritis Paternal Grandmother   . Depression Paternal Grandmother   . Cancer Paternal Grandmother     pancreatic  . Lung cancer Maternal Grandmother     Smoker  . High blood pressure Paternal Grandfather   . Heart disease Paternal Grandfather     a fib-- cab[facility]  . Depression Father   . Anxiety disorder Mother   . Anxiety disorder Sister   . COPD Maternal Grandfather    Social History  Substance Use Topics  . Smoking status: Never Smoker   . Smokeless tobacco: Never Used  . Alcohol Use: Yes     Comment: Occasionally   OB History    No data available     Review of Systems  Constitutional: Negative for fever and chills.  Respiratory: Negative for shortness of breath.   Cardiovascular: Negative for chest pain.  Gastrointestinal: Positive for nausea, abdominal pain and constipation. Negative for vomiting, diarrhea and blood in stool.  Genitourinary: Negative for dysuria, frequency, hematuria and  flank pain.  Musculoskeletal: Negative for back pain, neck pain and neck stiffness.  Skin: Negative for rash and wound.  Neurological: Negative for dizziness, weakness, light-headedness, numbness and headaches.  All other systems reviewed and are negative.     Allergies  Review of patient's allergies indicates no known allergies.  Home Medications   Prior to Admission medications   Medication Sig Start Date End Date Taking? Authorizing Provider  dicyclomine (BENTYL) 20 MG tablet Take 1 tablet (20 mg total) by mouth 2 (two) times daily as needed for spasms. 03/27/15   Loren Raceravid Pearle Wandler, MD  levonorgestrel-ethinyl estradiol (AVIANE,ALESSE,LESSINA) 0.1-20 MG-MCG tablet Take 1 tablet by mouth daily. 09/07/14   Lelon PerlaYvonne R Lowne, DO  ondansetron (ZOFRAN ODT) 4 MG disintegrating tablet 4mg  ODT q4 hours prn nausea/vomit 03/27/15   Loren Raceravid Thierno Hun, MD  PARoxetine (PAXIL) 20 MG tablet Take 1 tablet (20 mg total) by mouth daily. Patient taking differently: Take 10 mg by mouth daily.  09/07/14   Lelon PerlaYvonne R Lowne, DO  polyethylene glycol (MIRALAX / GLYCOLAX) packet Take 17 g by mouth daily. 03/27/15   Loren Raceravid Aviela Blundell, MD   BP 119/84 mmHg  Pulse 81  Temp(Src) 99 F (37.2 C) (Oral)  Resp 16  Ht 5\' 5"  (1.651 m)  Wt 200 lb (90.719 kg)  BMI 33.28 kg/m2  SpO2 99%  LMP 03/19/2015 Physical Exam  Constitutional: She is oriented to person, place, and time. She appears well-developed and well-nourished. No distress.  HENT:  Head: Normocephalic and atraumatic.  Mouth/Throat: Oropharynx is clear and moist.  Eyes: EOM are normal. Pupils are equal, round, and reactive to light.  Neck: Normal range of motion. Neck supple.  Cardiovascular: Normal rate and regular rhythm.   Pulmonary/Chest: Effort normal and breath sounds normal. No respiratory distress. She has no wheezes. She has no rales.  Abdominal: Soft. Bowel sounds are normal. She exhibits no distension and no mass. There is tenderness (atient with mild  discomfort with deep palpation in the epigastrium. No rebound or guarding.). There is no rebound and no guarding.  Musculoskeletal: Normal range of motion. She exhibits no edema or tenderness.  No CVA tenderness bilaterally.  Neurological: She is alert and oriented to person, place, and time.  Skin: Skin is warm and dry. No rash noted. No erythema.  Psychiatric: She has a normal mood and affect. Her behavior is normal.  Nursing note and vitals reviewed.   ED Course  Procedures (including critical care time) Labs Review Labs Reviewed  CBC WITH DIFFERENTIAL/PLATELET - Abnormal; Notable for the following:    WBC 11.6 (*)    Neutro Abs 8.9 (*)    All other components within normal limits  COMPREHENSIVE METABOLIC PANEL - Abnormal; Notable for the following:    Glucose, Bld 113 (*)    All other components within normal limits  PREGNANCY, URINE  URINALYSIS, ROUTINE W REFLEX MICROSCOPIC (NOT AT [facility])  LIPASE, BLOOD    Imaging Review Dg Abd 1 View  03/27/2015  CLINICAL DATA:  Intermittent and worsening abdominal pain since 03/24/2015. Initial encounter. EXAM: ABDOMEN - 1 VIEW COMPARISON:  None. FINDINGS: The bowel gas pattern is normal. Moderately large stool burden is noted. No radio-opaque calculi or other significant radiographic abnormality are seen. IMPRESSION: No acute abnormality. Moderately large stool burden. Electronically Signed   By: Drusilla Kanner M.D.   On: 03/27/2015 07:53   I have personally reviewed and evaluated these images and lab results as part of my medical decision-making.   EKG Interpretation None      MDM   Final diagnoses:  Abdominal spasms  Nausea  Constipation, unspecified constipation type    Patient presents with episodic epigastric pain and nausea.  GI cocktail with little improvement. Describes pain as "spasming". Patient has not had a bowel movement for several days. Question possible constipation with bowel spasm. We'll get a KUB and give  dose of Bentyl. Abdomen remains benign and nontender to palpation.  KUB with moderate stool burden specially in the transverse colon. Consistent with patient's symptoms. We'll discharge home with bowel regimen. Return precautions given.  Loren Racer, MD 03/27/15 [phone]

## 2015-03-27 NOTE — Discharge Instructions (Signed)
Constipation, Adult °Constipation is when a person has fewer than three bowel movements a week, has difficulty having a bowel movement, or has stools that are dry, hard, or larger than normal. As people grow older, constipation is more common. A low-fiber diet, not taking in enough fluids, and taking certain medicines may make constipation worse.  °CAUSES  °· Certain medicines, such as antidepressants, pain medicine, iron supplements, antacids, and water pills.   °· Certain diseases, such as diabetes, irritable bowel syndrome (IBS), thyroid disease, or depression.   °· Not drinking enough water.   °· Not eating enough fiber-rich foods.   °· Stress or travel.   °· Lack of physical activity or exercise.   °· Ignoring the urge to have a bowel movement.   °· Using laxatives too much.   °SIGNS AND SYMPTOMS  °· Having fewer than three bowel movements a week.   °· Straining to have a bowel movement.   °· Having stools that are hard, dry, or larger than normal.   °· Feeling full or bloated.   °· Pain in the lower abdomen.   °· Not feeling relief after having a bowel movement.   °DIAGNOSIS  °Your health care provider will take a medical history and perform a physical exam. Further testing may be done for severe constipation. Some tests may include: °· A barium enema X-ray to examine your rectum, colon, and, sometimes, your small intestine.   °· A sigmoidoscopy to examine your lower colon.   °· A colonoscopy to examine your entire colon. °TREATMENT  °Treatment will depend on the severity of your constipation and what is causing it. Some dietary treatments include drinking more fluids and eating more fiber-rich foods. Lifestyle treatments may include regular exercise. If these diet and lifestyle recommendations do not help, your health care provider may recommend taking over-the-counter laxative medicines to help you have bowel movements. Prescription medicines may be prescribed if over-the-counter medicines do not work.    °HOME CARE INSTRUCTIONS  °· Eat foods that have a lot of fiber, such as fruits, vegetables, whole grains, and beans. °· Limit foods high in fat and processed sugars, such as french fries, hamburgers, cookies, candies, and soda.   °· A fiber supplement may be added to your diet if you cannot get enough fiber from foods.   °· Drink enough fluids to keep your urine clear or pale yellow.   °· Exercise regularly or as directed by your health care provider.   °· Go to the restroom when you have the urge to go. Do not hold it.   °· Only take over-the-counter or prescription medicines as directed by your health care provider. Do not take other medicines for constipation without talking to your health care provider first.   °SEEK IMMEDIATE MEDICAL CARE IF:  °· You have bright red blood in your stool.   °· Your constipation lasts for more than 4 days or gets worse.   °· You have abdominal or rectal pain.   °· You have thin, pencil-like stools.   °· You have unexplained weight loss. °MAKE SURE YOU:  °· Understand these instructions. °· Will watch your condition. °· Will get help right away if you are not doing well or get worse. °  °This information is not intended to replace advice given to you by your health care provider. Make sure you discuss any questions you have with your health care provider. °  °Document Released: 01/25/2004 Document Revised: 05/19/2014 Document Reviewed: 02/07/2013 °Elsevier Interactive Patient Education ©2016 [employer] ° °Abdominal Pain, Adult °Many things can cause abdominal pain. Usually, abdominal pain is not caused by a disease and   will improve without treatment. It can often be observed and treated at home. Your health care provider will do a physical exam and possibly order blood tests and X-rays to help determine the seriousness of your pain. However, in many cases, more time must pass before a clear cause of the pain can be found. Before that point, your health care provider may not  know if you need more testing or further treatment. °HOME CARE INSTRUCTIONS °Monitor your abdominal pain for any changes. The following actions may help to alleviate any discomfort you are experiencing: °· Only take over-the-counter or prescription medicines as directed by your health care provider. °· Do not take laxatives unless directed to do so by your health care provider. °· Try a clear liquid diet (broth, tea, or water) as directed by your health care provider. Slowly move to a bland diet as tolerated. °SEEK MEDICAL CARE IF: °· You have unexplained abdominal pain. °· You have abdominal pain associated with nausea or diarrhea. °· You have pain when you urinate or have a bowel movement. °· You experience abdominal pain that wakes you in the night. °· You have abdominal pain that is worsened or improved by eating food. °· You have abdominal pain that is worsened with eating fatty foods. °· You have a fever. °SEEK IMMEDIATE MEDICAL CARE IF: °· Your pain does not go away within 2 hours. °· You keep throwing up (vomiting). °· Your pain is felt only in portions of the abdomen, such as the right side or the left lower portion of the abdomen. °· You pass bloody or black tarry stools. °MAKE SURE YOU: °· Understand these instructions. °· Will watch your condition. °· Will get help right away if you are not doing well or get worse. °  °This information is not intended to replace advice given to you by your health care provider. Make sure you discuss any questions you have with your health care provider. °  °Document Released: 02/05/2005 Document Revised: 01/17/2015 Document Reviewed: 01/05/2013 °Elsevier Interactive Patient Education ©2016 Elsevier Inc. ° °

## 2015-03-27 NOTE — ED Notes (Signed)
C/o abd pain off and on since Saturday w nausea

## 2015-03-27 NOTE — ED Notes (Signed)
C/o abd pain onset Saturday am  Off and on getting worse,  Nausea  Denies vomiting, denies burning w urination,  Denies vag dc

## 2015-04-11 ENCOUNTER — Other Ambulatory Visit: Payer: Self-pay | Admitting: Family Medicine

## 2015-04-11 NOTE — Telephone Encounter (Signed)
Last filled: 09/07/14 Amt: 90, 1 Last OV:  09/12/14 Next OV: 09/14/15  Med filled until next OV.

## 2015-05-13 NOTE — L&D Delivery Note (Signed)
Delivery Note At 12:57 AM a viable and healthy female was delivered via Vaginal, Spontaneous Delivery (Presentation: LOA).  APGAR: 8, 9; weight  pending.   Placenta status: spontaneous, intact.  Cord:  with the following complications: none.  Cord pH: na  Anesthesia:  epidural Episiotomy:  none Lacerations:  second Suture Repair: 2.0 vicryl rapide Est. Blood Loss (mL):  300  Mom to postpartum.  Baby to Couplet care / Skin to Skin.  Dvora Buitron J 02/01/2016, 1:12 AM

## 2015-05-31 ENCOUNTER — Emergency Department (HOSPITAL_COMMUNITY): Payer: 59

## 2015-05-31 ENCOUNTER — Emergency Department (HOSPITAL_COMMUNITY)
Admission: EM | Admit: 2015-05-31 | Discharge: 2015-05-31 | Disposition: A | Discharge status: Home / Self Care | Payer: 59 | Attending: Emergency Medicine | Admitting: Emergency Medicine

## 2015-05-31 ENCOUNTER — Encounter (HOSPITAL_COMMUNITY): Payer: Self-pay | Admitting: Family Medicine

## 2015-05-31 DIAGNOSIS — Z79899 Other long term (current) drug therapy: Secondary | ICD-10-CM | POA: Diagnosis not present

## 2015-05-31 DIAGNOSIS — H4710 Unspecified papilledema: Secondary | ICD-10-CM | POA: Diagnosis not present

## 2015-05-31 DIAGNOSIS — H47333 Pseudopapilledema of optic disc, bilateral: Secondary | ICD-10-CM | POA: Diagnosis not present

## 2015-05-31 DIAGNOSIS — H538 Other visual disturbances: Secondary | ICD-10-CM

## 2015-05-31 DIAGNOSIS — H1045 Other chronic allergic conjunctivitis: Secondary | ICD-10-CM | POA: Diagnosis not present

## 2015-05-31 DIAGNOSIS — H5203 Hypermetropia, bilateral: Secondary | ICD-10-CM | POA: Diagnosis not present

## 2015-05-31 MED ORDER — GADOBENATE DIMEGLUMINE 529 MG/ML IV SOLN
20.0000 mL | Freq: Once | INTRAVENOUS | Status: AC | PRN
  Administered 2015-05-31: 20 mL via INTRAVENOUS

## 2015-05-31 NOTE — Discharge Instructions (Signed)
Please call the neurologist for follow-up.  I think you'll benefit from a lumbar puncture as an outpatient to evaluate for idiopathic intracranial hypertension

## 2015-05-31 NOTE — ED Provider Notes (Signed)
7:06 PM Patient is well-appearing at this time.  Patient will follow-up as an outpatient with neurology.  Her MRI brain and MRI orbits is without abnormality.  She will likely need a lumbar puncture as an outpatient to evaluate for idiopathic intracranial hypertension.  I do not think this needs to be completed emergently today.  She understands the importance of ongoing follow-up.  She understands to return to the ER for new or worsening symptoms  Mr Lodema Pilot Contrast  05/31/2015  CLINICAL DATA:  Papilledema.  Blurred vision EXAM: MRI HEAD AND ORBITS WITHOUT AND WITH CONTRAST TECHNIQUE: Multiplanar, multiecho pulse sequences of the brain and surrounding structures were obtained without and with intravenous contrast. Multiplanar, multiecho pulse sequences of the orbits and surrounding structures were obtained including fat saturation techniques, before and after intravenous contrast administration. CONTRAST:  20mL MULTIHANCE GADOBENATE DIMEGLUMINE 529 MG/ML IV SOLN COMPARISON:  None. FINDINGS: MRI HEAD FINDINGS Ventricle size normal.  Cerebral volume normal. Negative for acute or chronic infarction Negative for demyelinating disease. Cerebral white matter normal. Basal ganglia and brainstem normal. Pituitary normal in size. Craniocervical junction normal. Negative for Chiari malformation. Paranasal sinuses clear.  Normal orbit. Negative for hemorrhage or mass lesion. Normal enhancement following contrast administration. No enhancing mass lesion. Vascular enhancement is normal. MRI ORBITS FINDINGS The globe is symmetric in size with normal signal. Normal lens location. Optic nerve normal in caliber and signal bilaterally. Extraocular muscles normal. Orbital fat normal. Lacrimal gland normal bilaterally. No at mass lesion in the orbit. Pituitary normal in size.  Cavernous sinus normal. Normal enhancement postcontrast. Extraocular muscles enhance normally. Paranasal sinuses are clear. IMPRESSION: Normal MRI of  the brain and orbit with contrast. Electronically Signed   By: Marlan Palau M.D.   On: 05/31/2015 18:40   Mr Birdie Hopes Wo/w Cm  05/31/2015  CLINICAL DATA:  Papilledema.  Blurred vision EXAM: MRI HEAD AND ORBITS WITHOUT AND WITH CONTRAST TECHNIQUE: Multiplanar, multiecho pulse sequences of the brain and surrounding structures were obtained without and with intravenous contrast. Multiplanar, multiecho pulse sequences of the orbits and surrounding structures were obtained including fat saturation techniques, before and after intravenous contrast administration. CONTRAST:  20mL MULTIHANCE GADOBENATE DIMEGLUMINE 529 MG/ML IV SOLN COMPARISON:  None. FINDINGS: MRI HEAD FINDINGS Ventricle size normal.  Cerebral volume normal. Negative for acute or chronic infarction Negative for demyelinating disease. Cerebral white matter normal. Basal ganglia and brainstem normal. Pituitary normal in size. Craniocervical junction normal. Negative for Chiari malformation. Paranasal sinuses clear.  Normal orbit. Negative for hemorrhage or mass lesion. Normal enhancement following contrast administration. No enhancing mass lesion. Vascular enhancement is normal. MRI ORBITS FINDINGS The globe is symmetric in size with normal signal. Normal lens location. Optic nerve normal in caliber and signal bilaterally. Extraocular muscles normal. Orbital fat normal. Lacrimal gland normal bilaterally. No at mass lesion in the orbit. Pituitary normal in size.  Cavernous sinus normal. Normal enhancement postcontrast. Extraocular muscles enhance normally. Paranasal sinuses are clear. IMPRESSION: Normal MRI of the brain and orbit with contrast. Electronically Signed   By: Marlan Palau M.D.   On: 05/31/2015 18:40   I personally reviewed the imaging tests through PACS system    Azalia Bilis, MD 05/31/15 [id number]

## 2015-05-31 NOTE — ED Provider Notes (Signed)
CSN: [id number]     Arrival date & time 05/31/15  1438 History   First MD Initiated Contact with Patient 05/31/15 1506     Chief Complaint  Patient presents with  . Eye Problem    HPI Patient presents to the emergency room for evaluation of possible elevated intracranial pressure. 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 She has noted some mild blurred vision over the last couple months. She was planning on seeing an eye doctor but she was not particular concern. His morning when she woke up she noticed some watery redness of her right eye as well as some discomfort. She went to see an eye doctor to be evaluated for that. In the evaluation they noted that her disc margins were indistinct. Otherwise the rest of her examination was unremarkable. She was sent to the emergency room to have a brain MRI and neurology consultation. History reviewed. No pertinent past medical history. History reviewed. No pertinent past surgical history. History reviewed. No pertinent family history. Social History  Substance Use Topics  . Smoking status: Never Smoker   . Smokeless tobacco: None  . Alcohol Use: None   OB History    No data available     Review of Systems  All other systems reviewed and are negative.     Allergies  Review of patient's allergies indicates no known allergies.  Home Medications   Prior to Admission medications   Medication Sig Start Date End Date Taking? Authorizing Provider  BIOTIN PO Take 1 tablet by mouth daily.   Yes Historical Provider, MD  folic acid (FOLVITE) 800 MCG tablet Take 400 mcg by mouth daily.   Yes Historical Provider, MD  ibuprofen (ADVIL,MOTRIN) 600 MG tablet Take 600 mg by mouth every 6 (six) hours as needed for moderate pain.   Yes Historical Provider, MD  PARoxetine (PAXIL) 20 MG tablet Take 20 mg by mouth daily.   Yes Historical Provider, MD   BP 121/89 mmHg  Pulse 75  Temp(Src) 98.2 F (36.8 C) (Oral)  Resp 18  Ht   (1.651 m)  Wt 92.789 kg  BMI 34.04 kg/m2  SpO2 99%  LMP 05/15/2015 Physical Exam  Constitutional: She is oriented to person, place, and time. She appears well-developed and well-nourished. No distress.  HENT:  Head: Normocephalic and atraumatic.  Right Ear: External ear normal.  Left Ear: External ear normal.  Mouth/Throat: Oropharynx is clear and moist.  Eyes: Conjunctivae are normal. Right eye exhibits no discharge. Left eye exhibits no discharge. No scleral icterus.  Indistinct disc margins  Neck: Neck supple. No tracheal deviation present.  Cardiovascular: Normal rate, regular rhythm and intact distal pulses.   Pulmonary/Chest: Effort normal and breath sounds normal. No stridor. No respiratory distress. She has no wheezes. She has no rales.  Abdominal: Soft. Bowel sounds are normal. She exhibits no distension. There is no tenderness. There is no rebound and no guarding.  Musculoskeletal: She exhibits no edema or tenderness.  Neurological: She is alert and oriented to person, place, and time. She has normal strength. No cranial nerve deficit (No facial droop, extraocular movements intact, tongue midline ) or sensory deficit. She exhibits normal muscle tone. She displays no seizure activity. Coordination normal.  No pronator drift bilateral upper extrem, able to hold both legs off bed for 5 seconds, sensation intact in all extremities, no visual field cuts, no left or right sided neglect, normal finger-nose exam bilaterally, no nystagmus noted  Skin: Skin is warm and dry. No rash noted.  Psychiatric: She has a normal mood and affect.  Nursing note and vitals reviewed.   ED Course  Procedures (including critical care time)  MRI brain and orbits ordered  Neurology consultation  MDM   Final diagnoses:  Papilledema     Sx are suspicious for Idiopathic intracranial hypertension, she has not focal neuro deficits.  Dr Cyril Mourning, neurology,  Consulted. Plan on MRI brain and  orbits to rule out mass lesion.  Further workup pending that result.    Linwood Dibbles, MD 05/31/15 [phone]

## 2015-05-31 NOTE — ED Notes (Signed)
Pt referred here by opthalmologic for MRI. They suspect idiopathic intracranial hypertension, but r/o other causes papilledema. Pt having eye swelling, pain and watery eye.

## 2015-05-31 NOTE — Consult Note (Signed)
NEURO HOSPITALIST CONSULT NOTE   Referring physician: Dr Lynelle Doctor Reason for Consult: new onset HA, visual changes, papilledema  HPI:                                                                                                                                          Susan Craig is an 25 y.o. 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 She indicated that for the past couple of weeks she has been experiencing daily, intermittent, dull, non disabling HA that principally involves the right side of the head. Similarly, she describes visual changes exclusively in the right eye that are characterized by " seeing white" and having redness, swelling, and increased lacrimation. Eye movements do not trigger pain. No visual obscurations, metamorphopsia, or scintillating scotomas. The HA typically develops throughout the course of the day, is not worsened by postural changes or valsalva's maneuver, and is not associated with nausea, vomiting, photophobia, phonophobia, neck pain, pulsatile tinnitus, focal weakness or numbness, slurred speech, unsteadiness, or language impairment. No recent fever, medical illness, or head trauma. No use of new medications, alcohol or illicit drugs. Susan Craig decided to be evaluated by opthalmology and was noted to have evidence of optic nerve swelling which prompted a request for the patient to be evaluated with neuro-imaging in the ED.  History reviewed. No pertinent past medical history.  History reviewed. No pertinent past surgical history.  History reviewed. No pertinent family history.  Family History: no brain tumor, aneurysm, MS, or epilepsy   Social History:  reports that she has never smoked. She does not have any smokeless tobacco history on file. Her alcohol and drug histories are not on file.  No Known  Allergies  MEDICATIONS:                                                                                                                     I have reviewed the patient's current medications.   ROS:  History obtained from chart review and the patient  General ROS: negative for - chills, fatigue, fever, night sweats, or weight loss Psychological ROS: negative for - behavioral disorder, hallucinations, memory difficulties, mood swings or suicidal ideation Ophthalmic ROS: negative for - double vision, eye pain or loss of vision ENT ROS: negative for - epistaxis, nasal discharge, oral lesions, sore throat, tinnitus or vertigo Allergy and Immunology ROS: negative for - hives or itchy/watery eyes Hematological and Lymphatic ROS: negative for - bleeding problems, bruising or swollen lymph nodes Endocrine ROS: negative for - galactorrhea, hair pattern changes, polydipsia/polyuria or temperature intolerance Respiratory ROS: negative for - cough, hemoptysis, shortness of breath or wheezing Cardiovascular ROS: negative for - chest pain, dyspnea on exertion, edema or irregular heartbeat Gastrointestinal ROS: negative for - abdominal pain, diarrhea, hematemesis, nausea/vomiting or stool incontinence Genito-Urinary ROS: negative for - dysuria, hematuria, incontinence or urinary frequency/urgency Musculoskeletal ROS: negative for - joint swelling or muscular weakness Neurological ROS: as noted in HPI Dermatological ROS: negative for rash and skin lesion changes  Physical exam:  Constitutional: well developed, pleasant female in no apparent distress. Blood pressure 121/89, pulse 75, temperature 98.2 F (36.8 C), temperature source Oral, resp. rate 18, height  (1.651 m), weight 92.789 kg (204 lb 9 oz), last menstrual period 05/15/2015, SpO2 99 %. Eyes: no jaundice or  exophthalmos.  Head: normocephalic. Neck: supple, no bruits, no JVD. Cardiac: no murmurs. Lungs: clear. Abdomen: soft, no tender, no mass. Extremities: no edema, clubbing, or cyanosis.  Skin: no rash  Neurologic Examination:                                                                                                      General: NAD Mental Status: Alert, oriented, thought content appropriate.  Speech fluent without evidence of aphasia.  Able to follow 3 step commands without difficulty. Cranial Nerves: II: Discs swelling right ; Visual fields grossly normal, pupils equal, round, reactive to light and accommodation III,IV, VI: ptosis not present, extra-ocular motions intact bilaterally V,VII: smile symmetric, facial light touch sensation normal bilaterally VIII: hearing normal bilaterally IX,X: uvula rises symmetrically XI: bilateral shoulder shrug XII: midline tongue extension without atrophy or fasciculations  Motor: Right : Upper extremity   5/5    Left:     Upper extremity   5/5  Lower extremity   5/5     Lower extremity   5/5 Tone and bulk:normal tone throughout; no atrophy noted Sensory: Pinprick and light touch intact throughout, bilaterally Deep Tendon Reflexes:  Right: Upper Extremity   Left: Upper extremity   biceps (C-5 to C-6) 2/4   biceps (C-5 to C-6) 2/4 tricep (C7) 2/4    triceps (C7) 2/4 Brachioradialis (C6) 2/4  Brachioradialis (C6) 2/4  Lower Extremity Lower Extremity  quadriceps (L-2 to L-4) 2/4   quadriceps (L-2 to L-4) 2/4 Achilles (S1) 2/4   Achilles (S1) 2/4  Plantars: Right: downgoing   Left: downgoing Cerebellar: normal finger-to-nose,  normal heel-to-shin test Gait:  No tested du to multiple leads CV: pulses palpable throughout    No  results found for: CHOL  No results found for this or any previous visit (from the past 48 hour(s)).  No results found.   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 Neuro-exam significant for optic disc swelling on the right. Main concern at this moment is for idiopathic intracranial hypertension but other central and causes must be excluded. Agree with MRI brain and orbits with and without contrast. High volume LP with opening pressure measurement in the lateral recumbent position if no obvious structural pathology seen on MRI. Will follow up  Susan Portela, MD 05/31/2015, 4:21 PM  [facility]

## 2015-06-01 ENCOUNTER — Encounter (HOSPITAL_BASED_OUTPATIENT_CLINIC_OR_DEPARTMENT_OTHER): Payer: Self-pay | Admitting: *Deleted

## 2015-06-01 ENCOUNTER — Telehealth: Payer: Self-pay | Admitting: Family Medicine

## 2015-06-01 ENCOUNTER — Other Ambulatory Visit: Payer: Self-pay | Admitting: Family Medicine

## 2015-06-01 DIAGNOSIS — H4710 Unspecified papilledema: Secondary | ICD-10-CM

## 2015-06-01 DIAGNOSIS — H538 Other visual disturbances: Secondary | ICD-10-CM

## 2015-06-01 NOTE — Telephone Encounter (Signed)
Message left to call the office.    KP 

## 2015-06-01 NOTE — Telephone Encounter (Signed)
-----   Message from Lelon Perla, DO sent at 06/01/2015  1:05 PM EST ----- ER spoke to neuro , Dr Cyril Mourning.  They did not feel like LP had to be done urgently-- but I'll have jen/marj call his office and try to get her in

## 2015-06-01 NOTE — Telephone Encounter (Signed)
Er said it did not need to be done emergently--- we will refer to neuro

## 2015-06-01 NOTE — Telephone Encounter (Signed)
Please review and advise     KP 

## 2015-06-01 NOTE — Telephone Encounter (Signed)
Pt went to eye doctor and was sent to ER where MRI was done. Pt said that she will need an eye surgery and [facility] said they don't do emergencies. She called [facility] and was told they will call her next week with an appt probably 1-2 weeks later. Pt would like to discuss ER/MRI results and find out if we need to get her in sooner somewhere. Please call [phone].

## 2015-06-04 ENCOUNTER — Encounter: Payer: Self-pay | Admitting: Neurology

## 2015-06-04 ENCOUNTER — Ambulatory Visit (INDEPENDENT_AMBULATORY_CARE_PROVIDER_SITE_OTHER): Payer: 59 | Admitting: Neurology

## 2015-06-04 VITALS — BP 112/79 | HR 71 | Ht 65.0 in | Wt 205.2 lb

## 2015-06-04 DIAGNOSIS — F411 Generalized anxiety disorder: Secondary | ICD-10-CM | POA: Diagnosis not present

## 2015-06-04 DIAGNOSIS — R519 Headache, unspecified: Secondary | ICD-10-CM | POA: Insufficient documentation

## 2015-06-04 DIAGNOSIS — H4710 Unspecified papilledema: Secondary | ICD-10-CM

## 2015-06-04 DIAGNOSIS — R51 Headache: Secondary | ICD-10-CM | POA: Diagnosis not present

## 2015-06-04 NOTE — Progress Notes (Signed)
F/u with pt and see how she is doing--- esp now that she has seen neuro

## 2015-06-04 NOTE — Progress Notes (Signed)
NEUROLOGY CLINIC NEW PATIENT NOTE  NAME: Susan Craig DOB: 27-Jan-1991 REFERRING PHYSICIAN: Lelon Perla, DO  I saw Susan Craig as a new consult in the neurology clinic today regarding  Chief Complaint  Patient presents with  . New Patient (Initial Visit)    from [facility] ED blurred vision May 31, 2015  .  HPI: Susan Craig is a 25 y.o. female with PMH of anxiety on Paxil who presents as a new patient for Headache and papilledema.   Patient stated that for the last 4 weeks she started to have every day headache, usually in the evening, and right side headache. The headache is hard to describe, but more of pressure-like headache, 6-7/10. In the morning headache is light or absent, gradually building up during the day and it is worse at night. Denies nausea vomiting, denies photophobia or phonophobia. Sometimes associate with dizziness-like feelings at work. Patient also complained of mild or subjective blurred vision in both eyes but right more than left. Denies any other symptoms of speech difficulty, weakness, numbness, or LOC. He had been on OCP for 8 years however stopped about 3 months ago. She denies any new food or medication recently. She denies any stress or lifestyle changes. She denies pregnancy. Bending over, coughing or sneezing does not make had a worse. She went to see ophthalmologist last week, was told to have bilateral papilledema, and referred to [facility] ED for evaluation. MRI brain and orbits to done both showed normal. She was referred here for further evaluation.  She had a history of anxiety and was on Paxil for 8 years. She also stated that she had been on eyeglasses for several years, with decreasing right-sided vision acuity. Since the headache, she had watery eyes and she thought it was due to eye straining, which trigger her to see ophthalmologist in the first place.  She denies any smoking or illicit drug use, admits social alcohol. Family history including  grandparents history of cancer, otherwise not significant.  Past Medical History  Diagnosis Date  . Chicken pox   . Anxiety    Past Surgical History  Procedure Laterality Date  . Tonsillectomy  2009  . Nose surgery      septoplasty x2 and revision x1--- [facility]   Family History  Problem Relation Age of Onset  . Arthritis Paternal Grandmother   . Depression Paternal Grandmother   . Cancer Paternal Grandmother     pancreatic  . Lung cancer Maternal Grandmother     Smoker  . High blood pressure Paternal Grandfather   . Heart disease Paternal Grandfather     a fib-- cabg  . Depression Father   . Anxiety disorder Mother   . Anxiety disorder Sister   . COPD Maternal Grandfather    Current Outpatient Prescriptions  Medication Sig Dispense Refill  . BIOTIN PO Take 1 tablet by mouth daily.    . folic acid (FOLVITE) 800 MCG tablet Take 400 mcg by mouth daily.    Marland Kitchen ibuprofen (ADVIL,MOTRIN) 600 MG tablet Take 600 mg by mouth every 6 (six) hours as needed for moderate pain.    Marland Kitchen PARoxetine (PAXIL) 20 MG tablet Take 20 mg by mouth daily.     No current facility-administered medications for this visit.   No Known Allergies Social History   Social History  . Marital Status: Married    Spouse Name: N/A  . Number of Children: N/A  . Years of Education: N/A   Occupational History  .  nurse [employer]   Social History Main Topics  . Smoking status: Never Smoker   . Smokeless tobacco: Not on file  . Alcohol Use: Yes     Comment: Occasionally  . Drug Use: No  . Sexual Activity:    Partners: Male    Birth Control/ Protection: Pill   Other Topics Concern  . Not on file   Social History Narrative   ** Merged History Encounter **        Review of Systems Full 14 system review of systems performed and notable only for those listed, all others are neg:  Constitutional:   Cardiovascular:  Ear/Nose/Throat:   Skin:  Eyes:  Blurry vision Respiratory:   Gastroitestinal:     Genitourinary:  Hematology/Lymphatic:   Endocrine:  Musculoskeletal:   Allergy/Immunology:   Neurological:  Headache Psychiatric:  Sleep:    Physical Exam  Filed Vitals:   06/04/15 1537  BP: 112/79  Pulse: 71    General - obese, well developed, in no apparent distress.  Ophthalmologic - bilateral papilledema.  Cardiovascular - Regular rate and rhythm with no murmur. Carotid pulses were 2+ without bruits .   Neck - supple, no nuchal rigidity .  Mental Status -  Level of arousal and orientation to time, place, and person were intact. Language including expression, naming, repetition, comprehension, reading, and writing was assessed and found intact. Attention span and concentration were normal. Recent and remote memory were intact. Fund of Knowledge was assessed and was intact.  Cranial Nerves II - XII - II - Visual field intact OU. Left eye 20/25, right eye 20/40 III, IV, VI - Extraocular movements intact. V - Facial sensation intact bilaterally. VII - Facial movement intact bilaterally. VIII - Hearing & vestibular intact bilaterally. X - Palate elevates symmetrically. XI - Chin turning & shoulder shrug intact bilaterally. XII - Tongue protrusion intact.  Motor Strength - The patient's strength was normal in all extremities and pronator drift was absent.  Bulk was normal and fasciculations were absent.   Motor Tone - Muscle tone was assessed at the neck and appendages and was normal.  Reflexes - The patient's reflexes were normal in all extremities and she had no pathological reflexes.  Sensory - Light touch, temperature/pinprick, vibration and proprioception, and Romberg testing were assessed and were normal.    Coordination - The patient had normal movements in the hands and feet with no ataxia or dysmetria.  Tremor was absent.  Gait and Station - The patient's transfers, posture, gait, station, and turns were observed as normal.   Imaging  MRI brain and  orbit with and without contrast 05/31/15 Normal MRI of the brain and orbit with contrast.  Lab Review None   Assessment and Plan:   In summary, Jaymarie A Strack is a 25 y.o. female with PMH of anxiety on paxil presents with HA and papilledema. She did have mild headache, but right-sided. She did have bilateral papilledema, but headache was not enhanced by bending over, coughing, or sneezing. Not very typical picture for increased ICP, however, we need to rule out pseudotumor cerebri as she is mildly obese with mild blurry vision, and also rule out CVT due to OCP history but has been off for 3 months now. Will do CTV and fluoroscopy guided LP.  - will check CT venogram to rule out CVT. - will do fluoro-guided LP to check for OP - depending on the results will develope management plan - continue to follow up with ophthalmologist  -  if have vision loss, or significant change, please go to ER for evaluation. - follow up in one month.   Thank you very much for the opportunity to participate in the care of this patient.  Please do not hesitate to call if any questions or concerns arise.  I spent more than 60 minutes of face to face time with the patient. Greater than 50% of time was spent in counseling and coordination of care. We have discussed about differential diagnosis of headache, and all available workup and treatment options.   Orders Placed This Encounter  Procedures  . CT Angio Head W/Cm &/Or Wo Cm    Please do CT venogram to rule out cerebral venous thrombosis. Her last menstrual period was 05/15/15.    Standing Status: Future     Number of Occurrences:      Standing Expiration Date: 08/01/2016    Order Specific Question:  If indicated for the ordered procedure, I authorize the administration of contrast media per Radiology protocol    Answer:  Yes    Order Specific Question:  Reason for Exam (SYMPTOM  OR DIAGNOSIS REQUIRED)    Answer:  HA with pepellidema rule out cerebral venous  thrombosis    Order Specific Question:  Is the patient pregnant?    Answer:  No    Order Specific Question:  Preferred imaging location?    Answer:  Internal  . DG FLUORO GUIDED LOC OF NEEDLE/CATH TIP FOR SPINAL INJECT LT    Please record opening pressure for evaluation of pseudotumor cerebri. Thanks.    Standing Status: Future     Number of Occurrences:      Standing Expiration Date: 08/01/2016    Order Specific Question:  Lab orders requested (DO NOT place separate lab orders, these will be automatically ordered during procedure specimen collection):    Answer:  CSF cell count w differential    Order Specific Question:  Lab orders requested (DO NOT place separate lab orders, these will be automatically ordered during procedure specimen collection):    Answer:  CSF culture    Order Specific Question:  Lab orders requested (DO NOT place separate lab orders, these will be automatically ordered during procedure specimen collection):    Answer:  Gram stain    Order Specific Question:  Lab orders requested (DO NOT place separate lab orders, these will be automatically ordered during procedure specimen collection):    Answer:  Protein and glucose, CSF    Order Specific Question:  Lab orders requested (DO NOT place separate lab orders, these will be automatically ordered during procedure specimen collection):    Answer:  VDRL, CSF    Order Specific Question:  Reason for Exam (SYMPTOM  OR DIAGNOSIS REQUIRED)    Answer:  HA with pepellidema rule out cerebral venous thrombosis    Order Specific Question:  Is the patient pregnant?    Answer:  No    Order Specific Question:  Preferred imaging location?    Answer:  Internal    No orders of the defined types were placed in this encounter.    Patient Instructions  - will check CT venogram to rule out venous thrombosis - will do fluro-guided LP to check opening pressure - depending on the results will have better management plan - continue to  follow up with ophthalmologist  - if have vision loss, or significant change, please go to ER for evaluation. - follow up in one month.    Marvel Plan, MD PhD  [facility] [address] [place], [place] [address] [phone]

## 2015-06-04 NOTE — Telephone Encounter (Signed)
Patient has seen Neuro today.     KP

## 2015-06-04 NOTE — Patient Instructions (Signed)
-   will check CT venogram to rule out venous thrombosis - will do fluro-guided LP to check opening pressure - depending on the results will have better management plan - continue to follow up with ophthalmologist  - if have vision loss, or significant change, please go to ER for evaluation. - follow up in one month.

## 2015-06-05 ENCOUNTER — Telehealth: Payer: Self-pay | Admitting: Family Medicine

## 2015-06-05 ENCOUNTER — Encounter (HOSPITAL_COMMUNITY): Payer: Self-pay | Admitting: Emergency Medicine

## 2015-06-05 ENCOUNTER — Other Ambulatory Visit: Payer: Self-pay | Admitting: Neurology

## 2015-06-05 ENCOUNTER — Emergency Department (HOSPITAL_COMMUNITY)
Admission: EM | Admit: 2015-06-05 | Discharge: 2015-06-05 | Disposition: A | Discharge status: Home / Self Care | Payer: 59 | Attending: Emergency Medicine | Admitting: Emergency Medicine

## 2015-06-05 ENCOUNTER — Inpatient Hospital Stay: Admission: RE | Admit: 2015-06-05 | Payer: Self-pay | Source: Ambulatory Visit

## 2015-06-05 ENCOUNTER — Telehealth: Payer: Self-pay | Admitting: Neurology

## 2015-06-05 DIAGNOSIS — E349 Endocrine disorder, unspecified: Secondary | ICD-10-CM

## 2015-06-05 DIAGNOSIS — Z79899 Other long term (current) drug therapy: Secondary | ICD-10-CM | POA: Diagnosis not present

## 2015-06-05 DIAGNOSIS — Z3202 Encounter for pregnancy test, result negative: Secondary | ICD-10-CM | POA: Insufficient documentation

## 2015-06-05 DIAGNOSIS — H4710 Unspecified papilledema: Secondary | ICD-10-CM

## 2015-06-05 DIAGNOSIS — Z3400 Encounter for supervision of normal first pregnancy, unspecified trimester: Secondary | ICD-10-CM | POA: Insufficient documentation

## 2015-06-05 DIAGNOSIS — Z8619 Personal history of other infectious and parasitic diseases: Secondary | ICD-10-CM | POA: Diagnosis not present

## 2015-06-05 DIAGNOSIS — F419 Anxiety disorder, unspecified: Secondary | ICD-10-CM | POA: Insufficient documentation

## 2015-06-05 DIAGNOSIS — R748 Abnormal levels of other serum enzymes: Secondary | ICD-10-CM | POA: Insufficient documentation

## 2015-06-05 DIAGNOSIS — Z3401 Encounter for supervision of normal first pregnancy, first trimester: Secondary | ICD-10-CM

## 2015-06-05 DIAGNOSIS — R7989 Other specified abnormal findings of blood chemistry: Secondary | ICD-10-CM | POA: Diagnosis present

## 2015-06-05 LAB — I-STAT BETA HCG BLOOD, ED (MC, WL, AP ONLY): I-stat hCG, quantitative: 25.8 m[IU]/mL — ABNORMAL HIGH (ref <5)

## 2015-06-05 LAB — HCG, QUANTITATIVE, PREGNANCY: HCG, BETA CHAIN, QUANT, S: 25 m[IU]/mL — AB (ref <5)

## 2015-06-05 NOTE — ED Notes (Signed)
Pt ambulates independently and with steady gait at time of discharge. Discharge instructions and follow up information reviewed with patient. No other questions or concerns voiced at this time.  

## 2015-06-05 NOTE — Telephone Encounter (Signed)
Rn call [facility] and talk to [place] about if patient needed urine test for pregnancy or blood. While on the phone Dr. Roda Shutters talk for 10 minutes holding and waiting to verified what kind of pregnancy test Gi needed for patient. Pts last period was January 3,2017. Dr Roda Shutters was told by GI that patient needed HCG test. This call was made prior to [phone] when patient and her mom call back again. [place] stated the patient LP and CT would have to be reschedule because they need the results of the HCG for pregnancy. Rn will call patient back today. ALso Gi will call the patient back to reschedule for tomorrow.

## 2015-06-05 NOTE — ED Provider Notes (Signed)
CSN: [id number]     Arrival date & time 06/05/15  1041 History  By signing my name below, I, Rohini Rajnarayanan, attest that this documentation has been prepared under the direction and in the presence of Teressa Lower, NP.  Electronically Signed: Charlean Merl, ED Scribe 06/05/2015 at 12:19 PM.   Chief Complaint  Patient presents with  . Labs Only   The history is provided by the patient. No language interpreter was used.    HPI Comments: Susan Craig is a 25 y.o. female who presents to the Emergency Department for lab work. Pt was previously diagnosed with papilledema and is scheduled for a CT later today. She is requesting a Beta hCG test before her CT.    Past Medical History  Diagnosis Date  . Chicken pox   . Anxiety    Past Surgical History  Procedure Laterality Date  . Tonsillectomy  2009  . Nose surgery      septoplasty x2 and revision x1--- [facility]   Family History  Problem Relation Age of Onset  . Arthritis Paternal Grandmother   . Depression Paternal Grandmother   . Cancer Paternal Grandmother     pancreatic  . Lung cancer Maternal Grandmother     Smoker  . High blood pressure Paternal Grandfather   . Heart disease Paternal Grandfather     a fib-- cabg  . Depression Father   . Anxiety disorder Mother   . Anxiety disorder Sister   . COPD Maternal Grandfather    Social History  Substance Use Topics  . Smoking status: Never Smoker   . Smokeless tobacco: Not on file  . Alcohol Use: Yes     Comment: Occasionally   OB History    Gravida Para Term Preterm AB TAB SAB Ectopic Multiple Living   0 0 0 0 0 0 0 0       Review of Systems  Constitutional: Negative for fever.  All other systems reviewed and are negative.   Allergies  Review of patient's allergies indicates no known allergies.  Home Medications   Prior to Admission medications   Medication Sig Start Date End Date Taking? Authorizing Provider  BIOTIN PO Take 1 tablet by mouth  daily.    Historical Provider, MD  folic acid (FOLVITE) 800 MCG tablet Take 400 mcg by mouth daily.    Historical Provider, MD  ibuprofen (ADVIL,MOTRIN) 600 MG tablet Take 600 mg by mouth every 6 (six) hours as needed for moderate pain.    Historical Provider, MD  PARoxetine (PAXIL) 20 MG tablet Take 20 mg by mouth daily.    Historical Provider, MD   BP 124/78 mmHg  Pulse 95  Temp(Src) 97.5 F (36.4 C) (Oral)  Resp 16  SpO2 100%  LMP 05/15/2015  Physical Exam  Constitutional: She is oriented to person, place, and time. She appears well-developed and well-nourished. No distress.  Neck: No tracheal deviation present.  Musculoskeletal: Normal range of motion.  Neurological: She is alert and oriented to person, place, and time.  Skin: Skin is warm and dry.  Psychiatric: She has a normal mood and affect. Her behavior is normal.  Nursing note and vitals reviewed.  ED Course  Procedures (including critical care time) DIAGNOSTIC STUDIES: Oxygen Saturation is 100% on RA, normal by my interpretation.    COORDINATION OF CARE: 11:11 AM-Discussed treatment plan which includes Beta hCG  with pt at bedside and pt agreed to plan.   Labs Review Labs Reviewed  I-STAT BETA HCG  BLOOD, ED ([facility], [facility], [facility] ONLY) - Abnormal; Notable for the following:    I-stat hCG, quantitative 25.8 (*)    All other components within normal limits  BETA HCG, QUANT (TUMOR MARKER)  HCG, QUANTITATIVE, PREGNANCY   Imaging Review No results found. I have personally reviewed and evaluated these images and lab results as part of my medical decision-making.   EKG Interpretation None      MDM   Final diagnoses:  Elevated serum hCG   Pt hcg elevated. Discussed results with pt  I personally performed the services described in this documentation, which was scribed in my presence. The recorded information has been reviewed and is accurate.      Teressa Lower, NP 06/05/15 1411  Alvira Monday, MD 06/05/15  2322

## 2015-06-05 NOTE — Telephone Encounter (Signed)
Rn call patient back about the need for a HCG blood work for pregnancy to have the Ct of the head and LP at [facility]. Rn explain to patient that her mom was the one that hung up the phone on phone staff. Rn explain that Dr. Roda Shutters was on the phone with [facility] trying to get clarification with them about which test to order. Rolly Salter stated she found a place that can do her HCG labs and process it the same day. Rn explain to her that Dr. Roda Shutters did put in a order for HCG tumor marker to be done at [facility]. Rn explain that it needs to be the same lab work. Pt wants to have the LP and Ct angiogram done today because of her work schedule. Pt verbalized understanding and will call if the lab at the urgent care cannot be process today. Pt is already schedule at [facility] for lab work. Rn gave times for lab if the other office cannot process it.

## 2015-06-05 NOTE — Progress Notes (Signed)
Serum HCG elevated at one week pregnancy (seems accurate as her last menstrual period was 05/15/15). Will cancel CTV and fluoro LP. Will do MRV without contrast and bedside LP. She is scheduled today for MRV and tomorrow for LP. Pt is aware.  Marvel Plan, MD PhD Stroke Neurology 06/05/2015 1:44 PM  Component     Latest Ref Rng 06/05/2015  I-stat hCG, quantitative     <5 mIU/mL 25.8 (H)  Comment 3        HCG, Beta Chain, Quant, S     <5 mIU/mL 25 (H)

## 2015-06-05 NOTE — Telephone Encounter (Signed)
Susan Craig with GI called said pt was still on schedule but she was looking at dx code that sts pt is pregnant. While we were talking the pt called and c/a appt said she was pregnant.

## 2015-06-05 NOTE — Telephone Encounter (Signed)
Rayfield Citizen with GI is calling in regard tp a stat lumbar puncture test the patient is telling her she was told she is having.  Patient would like to schedule the same day as her CT skan but GI needs an order.  Could you please call Rayfield Citizen [phone].  Thanks!

## 2015-06-05 NOTE — Telephone Encounter (Signed)
She needs to wean off---- take 10 mg for about a week then stop

## 2015-06-05 NOTE — Telephone Encounter (Signed)
Patient was call by Dr.Xu and she was schedule for LP tomorrow at [facility] at 0300pm.Pt agreed to the LP at [facility].

## 2015-06-05 NOTE — Telephone Encounter (Signed)
Patient states she needs to speak with you immediately before she goes to her appt.  Please call.

## 2015-06-05 NOTE — Telephone Encounter (Signed)
Called patient and made her aware.  She stated understanding, but voiced her concerns about being off of Paxil.  Pt states she tried to self-wean recently by decreasing her dose to 10 mg and she was very irritable and moody, so she increased it back to 20 mg.  She is willing to try weaning again, but wants to know if there is anything that she could safely take during pregnancy.  She says that she has called OB-GYN to schedule an appt, but she's waiting to hear back from them.  She also wanted to make provider aware that she is scheduled for LP tomorrow and a MRV has been ordered,but she's waiting to hear back about the appt for the same.   Pt states she's doing okay, just stress over current situation.   Please review and advise regarding alternative med for Paxil.

## 2015-06-05 NOTE — Telephone Encounter (Signed)
Pt called said Dr Roda Shutters called her yesterday afternoon said she needs to have a pregnancy test but Dr Roda Shutters said it is too early to have urine test so she said GI is wanting her to have a blood pregnancy test prior to LP and CT today. She is inquiring where to go for stat pregnancy test? Please call asap at [phone]

## 2015-06-05 NOTE — Addendum Note (Signed)
Addended by: Marvel Plan on: 06/05/2015 01:58 PM   Modules accepted: Orders

## 2015-06-05 NOTE — Telephone Encounter (Addendum)
Patient is calling back and states she has had the pregnancy test and is not pregnant so we need [place] to let [facility] know to proceed with Ct of the head and LP tests. Patient is scheduled for 12:15 today at GI.

## 2015-06-05 NOTE — Telephone Encounter (Signed)
Caller name: Skarlett   Relationship to patient: Self  Can be reached: [phone]  Reason for call: Pt says that she just found out that she is pregnant. Pt's concern is that she is taking Paxil. She would like to know if it is okay to still take medication.    Please advise further.    Thanks.

## 2015-06-05 NOTE — Telephone Encounter (Signed)
Please advise      KP 

## 2015-06-05 NOTE — Telephone Encounter (Signed)
Rn call patient and her mom answer the phone. Her mom is on the [facility] form. Pts mom stated she is at the Ed and getting the stat HCG blood work for pregnancy. Rn stated pt needs a printout of the blood work to have test done.

## 2015-06-05 NOTE — Telephone Encounter (Signed)
Pt called and states that she is going to go somewhere else for a blood pregnancy test. She thank you for the help though.

## 2015-06-05 NOTE — Telephone Encounter (Signed)
Mother called said she needed to speak with RN immediately reg her daughter's blood pregnancy test. I relayed that to her that the pt had called this morning and the RN will call within 2hrs of that call as it was sent back urgent. She said it cannot wait 2 hrs as the pt was to be at GI at 12pm and needed orders for pregnancy test prior to going. I relayed that I would check with RN to see if she could speak with mom (she is on DPR). I skyped Susan Craig but in the process of waiting for a 2 minute response from RN she hung up.

## 2015-06-05 NOTE — Telephone Encounter (Signed)
They can put her on something to take its place

## 2015-06-06 ENCOUNTER — Encounter: Payer: Self-pay | Admitting: Neurology

## 2015-06-06 ENCOUNTER — Ambulatory Visit
Admission: RE | Admit: 2015-06-06 | Discharge: 2015-06-06 | Disposition: A | Discharge status: Home / Self Care | Payer: 59 | Source: Ambulatory Visit | Attending: Neurology | Admitting: Neurology

## 2015-06-06 ENCOUNTER — Ambulatory Visit (INDEPENDENT_AMBULATORY_CARE_PROVIDER_SITE_OTHER): Payer: 59 | Admitting: Neurology

## 2015-06-06 VITALS — BP 117/81 | HR 78 | Ht 65.0 in | Wt 202.8 lb

## 2015-06-06 DIAGNOSIS — H4710 Unspecified papilledema: Secondary | ICD-10-CM | POA: Diagnosis not present

## 2015-06-06 DIAGNOSIS — G932 Benign intracranial hypertension: Secondary | ICD-10-CM | POA: Insufficient documentation

## 2015-06-06 LAB — BETA HCG QUANT (REF LAB): BETA HCG, TUMOR MARKER: 20 m[IU]/mL

## 2015-06-06 NOTE — Telephone Encounter (Signed)
Pt notified made aware.  She stated understanding and agrees with plan.  She says that she has spoken to her OB and appts have been set up.

## 2015-06-06 NOTE — Progress Notes (Signed)
ADDENDUM: Pt opening pressure was 31cm H2O after leg extension, fulfill the criteria for diagnosis of pseudotumor cerebri. 30cc CSF removed. Closing pressure was not recorded however. Her MRV was negative for CVT although official result pending. Due to her pregnancy status, diamox is not recommended at this time. I recommended to her that she check with her ophthalmologist every 4 weeks for monitoring papilledema. At the meantime, clinical observation of HA and vision changes. If sign of increased ICP again with symptoms, will do serial LPs. Will up to her OB physician to consider when oral diamox can be considered.   Marvel Plan, MD PhD Stroke Neurology 06/06/2015 4:56 PM   Procedure Note: Lumbar puncture  Indications: HA with papilledema to rule out or rule in pseudotumor cerebri  Operator: Marvel Plan, MD, PhD  Others present: Sherrie George, RN  Indications, risks, and benefits explained to patient / surrogate decision maker and informed consent obtained. Time-out was performed, with all individuals present agreeing on the procedure to be performed, the site of procedure, and the patient identity.  Patient positioned, prepped and draped in usual sterile fashion. L3-4 space located using bilateral iliac crests as landmarks. 1% Lidocaine without epinephrine was used to anesthetize the area. An 20G spinal needle was introduced into the sub- arachnoid space. Stylet was removed with appropriate fluid return. Needle removed after adequate fluid collected. Blood loss was minimal. A dry guaze dressing was placed over insertion site. Patient tolerated the procedure well and no complications were observed. Spontaneous movement of bilateral extremities were observed after the procedure.  Opening pressure: 31 cm H20 (hips & legs extended)  Total fluid removed: 30cc  Color of fluid: clear  Sent for: cell count + diff , gram stain, cultures, glucose, protein, crypto antigen, VDRL  Marvel Plan, MD  PhD Stroke Neurology 06/06/2015 4:56 PM

## 2015-06-07 ENCOUNTER — Other Ambulatory Visit (HOSPITAL_COMMUNITY)
Admission: RE | Admit: 2015-06-07 | Discharge: 2015-06-07 | Disposition: A | Discharge status: Home / Self Care | Payer: 59 | Source: Ambulatory Visit | Attending: Obstetrics and Gynecology | Admitting: Obstetrics and Gynecology

## 2015-06-07 DIAGNOSIS — Z331 Pregnant state, incidental: Secondary | ICD-10-CM | POA: Diagnosis not present

## 2015-06-07 LAB — CELL COUNT, CSF
NUC CELL # CSF: 1 {cells}/uL (ref 0–5)
RBC COUNT CSF: 0 /uL

## 2015-06-07 LAB — VDRL, CSF: SYPHILIS VDRL QUANT CSF: NONREACTIVE

## 2015-06-07 LAB — HCG, QUANTITATIVE, PREGNANCY: hCG, Beta Chain, Quant, S: 97 m[IU]/mL — ABNORMAL HIGH (ref <5)

## 2015-06-07 LAB — PROTEIN, CSF: Protein, CSF: 17.9 mg/dL (ref 0.0–44.0)

## 2015-06-07 LAB — GLUCOSE, CSF: Glucose, CSF: 59 mg/dL (ref 40–70)

## 2015-06-08 ENCOUNTER — Telehealth: Payer: Self-pay

## 2015-06-08 LAB — CRYPTOCOCCUS ANTIGEN, CSF: Cryptococcus Antigen, CSF: NEGATIVE

## 2015-06-08 NOTE — Telephone Encounter (Signed)
-----   Message from Marvel Plan, MD sent at 06/07/2015  6:39 PM EST ----- Could you please let the patient know that the CSF cell count, protein, glucose, all negative. So far no other concerns. Please continue current treatment. Thanks.  Marvel Plan, MD PhD Stroke Neurology 06/07/2015 6:39 PM

## 2015-06-08 NOTE — Telephone Encounter (Signed)
Rn call patient and gave her the CSF cell count, protein, glucose were all negative. Pt verbalized understanding. Pt will follow up as needed.

## 2015-06-08 NOTE — Telephone Encounter (Signed)
-----   Message from Jindong Xu, MD sent at 06/07/2015  6:39 PM EST ----- Could you please let the patient know that the CSF cell count, protein, glucose, all negative. So far no other concerns. Please continue current treatment. Thanks.  Jindong Xu, MD PhD Stroke Neurology 06/07/2015 6:39 PM    

## 2015-06-08 NOTE — Telephone Encounter (Signed)
LfT VM for patient to call about CSF labs from lumbar puncture.

## 2015-06-11 ENCOUNTER — Other Ambulatory Visit (HOSPITAL_COMMUNITY)
Admission: AD | Admit: 2015-06-11 | Discharge: 2015-06-11 | Disposition: A | Discharge status: Home / Self Care | Payer: 59 | Source: Ambulatory Visit | Attending: Obstetrics and Gynecology | Admitting: Obstetrics and Gynecology

## 2015-06-11 DIAGNOSIS — Z331 Pregnant state, incidental: Secondary | ICD-10-CM | POA: Insufficient documentation

## 2015-06-11 LAB — HCG, QUANTITATIVE, PREGNANCY: HCG, BETA CHAIN, QUANT, S: 481 m[IU]/mL — AB (ref <5)

## 2015-06-11 LAB — BODY FLUID CULTURE

## 2015-06-26 DIAGNOSIS — Z3201 Encounter for pregnancy test, result positive: Secondary | ICD-10-CM | POA: Diagnosis not present

## 2015-06-28 DIAGNOSIS — H4710 Unspecified papilledema: Secondary | ICD-10-CM | POA: Diagnosis not present

## 2015-06-28 DIAGNOSIS — H1045 Other chronic allergic conjunctivitis: Secondary | ICD-10-CM | POA: Diagnosis not present

## 2015-06-28 DIAGNOSIS — H16141 Punctate keratitis, right eye: Secondary | ICD-10-CM | POA: Diagnosis not present

## 2015-06-29 ENCOUNTER — Encounter: Payer: Self-pay | Admitting: Family Medicine

## 2015-07-13 DIAGNOSIS — H11153 Pinguecula, bilateral: Secondary | ICD-10-CM | POA: Diagnosis not present

## 2015-07-13 DIAGNOSIS — H4710 Unspecified papilledema: Secondary | ICD-10-CM | POA: Diagnosis not present

## 2015-07-18 ENCOUNTER — Ambulatory Visit: Payer: Self-pay | Admitting: Neurology

## 2015-07-19 DIAGNOSIS — Z36 Encounter for antenatal screening of mother: Secondary | ICD-10-CM | POA: Diagnosis not present

## 2015-07-19 DIAGNOSIS — F419 Anxiety disorder, unspecified: Secondary | ICD-10-CM | POA: Diagnosis not present

## 2015-07-19 DIAGNOSIS — Z3401 Encounter for supervision of normal first pregnancy, first trimester: Secondary | ICD-10-CM | POA: Diagnosis not present

## 2015-07-19 LAB — OB RESULTS CONSOLE RUBELLA ANTIBODY, IGM: Rubella: IMMUNE

## 2015-07-19 LAB — OB RESULTS CONSOLE HEPATITIS B SURFACE ANTIGEN: Hepatitis B Surface Ag: NEGATIVE

## 2015-07-19 LAB — OB RESULTS CONSOLE HIV ANTIBODY (ROUTINE TESTING): HIV: NONREACTIVE

## 2015-07-19 LAB — OB RESULTS CONSOLE ABO/RH: RH Type: POSITIVE

## 2015-07-19 LAB — OB RESULTS CONSOLE RPR: RPR: NONREACTIVE

## 2015-07-19 LAB — OB RESULTS CONSOLE ANTIBODY SCREEN: Antibody Screen: NEGATIVE

## 2015-07-19 MED FILL — SERTRALINE HCL 25 MG TABLET: 25 | 30 days supply | Qty: 60 | Fill #0

## 2015-07-24 DIAGNOSIS — H4710 Unspecified papilledema: Secondary | ICD-10-CM | POA: Diagnosis not present

## 2015-07-24 DIAGNOSIS — Z3401 Encounter for supervision of normal first pregnancy, first trimester: Secondary | ICD-10-CM | POA: Diagnosis not present

## 2015-07-24 DIAGNOSIS — H10811 Pingueculitis, right eye: Secondary | ICD-10-CM | POA: Diagnosis not present

## 2015-07-24 LAB — OB RESULTS CONSOLE GC/CHLAMYDIA
Chlamydia: NEGATIVE
GC PROBE AMP, GENITAL: NEGATIVE

## 2015-07-31 ENCOUNTER — Telehealth: Payer: Self-pay | Admitting: Neurology

## 2015-07-31 MED FILL — DICLEGIS DR 10-10 MG TABLET: 10-10 | 25 days supply | Qty: 100 | Fill #0

## 2015-07-31 NOTE — Telephone Encounter (Signed)
Called pt today and she stated that she has on and off HA and ear pounding sensation since 2 weeks ago and yesterday her HA and ear pounding getting worse. She has seen ENT and was told no problem in ears. She last seen her ophthalmology two weeks ago and was told still had papilledema, but no change over time. She denies any vision changes. But stated that if cough, strain or bending over, HA can be worse. I am not sure if her intracranial pressure is up now but have to do LP to find out. She was scheduled tomorrow for LP in clinic.   Susan PlanJindong Darwyn Ponzo, MD PhD Stroke Neurology 07/31/2015 5:41 PM

## 2015-07-31 NOTE — Telephone Encounter (Addendum)
Rn call patient about having another LP per Dr.Xu last note. PT is currently pregnant. Pt stated she has seen an ear doctor for pounding in her ears,but thinks her pressure is up. Rn stated she will send a message to Dr. Roda ShuttersXu. Pt also stated her headache got worse last night.

## 2015-07-31 NOTE — Telephone Encounter (Signed)
Patient schedule for LP per Dr. Roda ShuttersXu at [phone]. Pt has been call about appt time.

## 2015-07-31 NOTE — Telephone Encounter (Signed)
Pt called inquiring if she could have LP. She said the HA's got worse last night. She had pounding in her ears which has continued today. No sensitivity to light or sound.

## 2015-08-01 ENCOUNTER — Ambulatory Visit (INDEPENDENT_AMBULATORY_CARE_PROVIDER_SITE_OTHER): Payer: 59 | Admitting: Neurology

## 2015-08-01 ENCOUNTER — Encounter: Payer: Self-pay | Admitting: Neurology

## 2015-08-01 VITALS — BP 114/77 | HR 92 | Ht 65.0 in | Wt 197.2 lb

## 2015-08-01 DIAGNOSIS — G932 Benign intracranial hypertension: Secondary | ICD-10-CM

## 2015-08-01 NOTE — Progress Notes (Signed)
Procedure Note: Lumbar puncture  Indications: high ICP  Operator: Marvel PlanJindong Saragrace Selke, MD, PhD  Others present: none  Indications, risks, and benefits explained to patient / surrogate decision maker and informed consent obtained. Time-out was performed, with all individuals present agreeing on the procedure to be performed, the site of procedure, and the patient identity.  Patient positioned, prepped and draped in usual sterile fashion. L3-4 space located using bilateral iliac crests as landmarks. 1% Lidocaine without epinephrine was used to anesthetize the area. An 20G spinal needle was introduced into the sub- arachnoid space. Stylet was removed with appropriate fluid return. Needle removed after adequate fluid collected. Blood loss was minimal. A dry guaze dressing was placed over insertion site. Patient tolerated the procedure well and no complications were observed. Spontaneous movement of bilateral extremities were observed after the procedure.  Opening pressure: 32 cm H20 (legs extended)  Closing pressure: 9 cmH2O  Total fluid removed: 35cc  Color of fluid: clear  Sent for: cell count + diff , glucose, protein  Marvel PlanJindong Durinda Buzzelli, MD PhD Stroke Neurology 08/01/2015 2:11 PM

## 2015-08-02 LAB — CELL COUNT, CSF
NUC CELL # CSF: 1 {cells}/uL (ref 0–5)
RBC, CSF: 38 /uL

## 2015-08-02 LAB — GLUCOSE, CSF: GLUCOSE CSF: 77 mg/dL — AB (ref 40–70)

## 2015-08-02 LAB — PROTEIN, CSF: Protein, CSF: 15.2 mg/dL (ref 0.0–44.0)

## 2015-08-16 ENCOUNTER — Telehealth: Payer: Self-pay | Admitting: Neurology

## 2015-08-16 NOTE — Telephone Encounter (Signed)
[facility] Dr Rosemary Holmsavon (p) [phone] (f) [phone] called requesting last office note. Thanks you

## 2015-08-22 MED FILL — SERTRALINE HCL 25 MG TABLET: 25 | 30 days supply | Qty: 60 | Fill #1

## 2015-09-05 DIAGNOSIS — Z36 Encounter for antenatal screening of mother: Secondary | ICD-10-CM | POA: Diagnosis not present

## 2015-09-11 ENCOUNTER — Telehealth: Payer: Self-pay | Admitting: Neurology

## 2015-09-11 DIAGNOSIS — H4710 Unspecified papilledema: Secondary | ICD-10-CM | POA: Diagnosis not present

## 2015-09-11 DIAGNOSIS — G932 Benign intracranial hypertension: Secondary | ICD-10-CM

## 2015-09-11 NOTE — Telephone Encounter (Signed)
Patient called back states Ophthalmologist is sending over notes.

## 2015-09-11 NOTE — Telephone Encounter (Signed)
Patient called, states OB-GYN and Ophthalmologist want her to start DIAMOX, also states Ophthalmologist should be sending notes to our office.

## 2015-09-11 NOTE — Telephone Encounter (Signed)
LFt vm for patient about having notes fax to [phone] about her starting diamox. Message will be sent to Dr. Roda ShuttersXu.

## 2015-09-11 NOTE — Telephone Encounter (Signed)
Thanks for the info. Let's see what note say. Ideally, the note should come from OBGYN regarding the safety of diamox in second trimester.   Marvel PlanJindong Lemario Chaikin, MD PhD Stroke Neurology 09/11/2015 4:38 PM

## 2015-09-12 NOTE — Telephone Encounter (Signed)
I called pt at the number on file. But she did not pick up the phone and the voice box is full and I can not leave messages. I will try later.   Marvel PlanJindong Yani Lal, MD PhD Stroke Neurology 09/12/2015 5:34 PM

## 2015-09-12 NOTE — Telephone Encounter (Signed)
Fax receive from patients OBGYN md about clearance to take diamox medication. Letter was given to Dr. Roda ShuttersXu.

## 2015-09-12 NOTE — Telephone Encounter (Signed)
LFt vm for patient that Dr. Roda ShuttersXu needs note from OBGYN stating its safe for patient to take diamox. Letter was receive from the eye doctor. Rn left fax number of [phone]. Dr. Roda ShuttersXu cant prescribed unless he receives a note from the OBGYN.

## 2015-09-13 MED ORDER — ACETAZOLAMIDE ER 500 MG PO CP12: 500.0000 mg | ORAL_CAPSULE | Freq: Two times a day (BID) | ORAL | Status: DC

## 2015-09-13 MED ORDER — ACETAZOLAMIDE 250 MG PO TABS: ORAL_TABLET | ORAL | Status: DC

## 2015-09-13 NOTE — Telephone Encounter (Signed)
Called pt again and she did not pick up the phone. Left VM for her to call back. But anyway, will prescribe diamox for her this time.  Marvel PlanJindong Simcha Farrington, MD PhD Stroke Neurology 09/13/2015 6:19 PM  Meds ordered this encounter  Medications  . acetaZOLAMIDE (DIAMOX) 250 MG tablet    Sig: 125mg  bid for 5 days and then 250mg  bid for 5 days.    Dispense:  15 tablet    Refill:  0  . acetaZOLAMIDE (DIAMOX) 500 MG capsule    Sig: Take 1 capsule (500 mg total) by mouth 2 (two) times daily.    Dispense:  60 capsule    Refill:  2

## 2015-09-14 ENCOUNTER — Telehealth: Payer: Self-pay | Admitting: *Deleted

## 2015-09-14 ENCOUNTER — Encounter: Payer: Self-pay | Admitting: Family Medicine

## 2015-09-14 ENCOUNTER — Ambulatory Visit (INDEPENDENT_AMBULATORY_CARE_PROVIDER_SITE_OTHER): Payer: 59 | Admitting: Family Medicine

## 2015-09-14 VITALS — BP 108/60 | HR 74 | Temp 98.4°F | Ht 65.0 in | Wt 199.2 lb

## 2015-09-14 DIAGNOSIS — Z0000 Encounter for general adult medical examination without abnormal findings: Secondary | ICD-10-CM

## 2015-09-14 MED FILL — acetaZOLAMIDE 250 MG TABS: 250 | 10 days supply | Qty: 15 | Fill #0

## 2015-09-14 MED FILL — ACETAZOLAMIDE ER 500 MG CAP: 500 | 30 days supply | Qty: 60 | Fill #0

## 2015-09-14 NOTE — Telephone Encounter (Signed)
Rn return patients phone concerning diamox medication. Pt stated she will be starting till she is 20 weeks in two weeks. Pt will get the medication and start in two weeks. Rn gave patient the instructions for tapering and increasing the dosage. Pt verbalized understanding and will call the office if she has any side effects.

## 2015-09-14 NOTE — Patient Instructions (Signed)
Preventive Care for Adults, Female A healthy lifestyle and preventive care can promote health and wellness. Preventive health guidelines for women include the following key practices.  A routine yearly physical is a good way to check with your health care provider about your health and preventive screening. It is a chance to share any concerns and updates on your health and to receive a thorough exam.  Visit your dentist for a routine exam and preventive care every 6 months. Brush your teeth twice a day and floss once a day. Good oral hygiene prevents tooth decay and gum disease.  The frequency of eye exams is based on your age, health, family medical history, use of contact lenses, and other factors. Follow your health care provider's recommendations for frequency of eye exams.  Eat a healthy diet. Foods like vegetables, fruits, whole grains, low-fat dairy products, and lean protein foods contain the nutrients you need without too many calories. Decrease your intake of foods high in solid fats, added sugars, and salt. Eat the right amount of calories for you.Get information about a proper diet from your health care provider, if necessary.  Regular physical exercise is one of the most important things you can do for your health. Most adults should get at least 150 minutes of moderate-intensity exercise (any activity that increases your heart rate and causes you to sweat) each week. In addition, most adults need muscle-strengthening exercises on 2 or more days a week.  Maintain a healthy weight. The body mass index (BMI) is a screening tool to identify possible weight problems. It provides an estimate of body fat based on height and weight. Your health care provider can find your BMI and can help you achieve or maintain a healthy weight.For adults 20 years and older:  A BMI below 18.5 is considered underweight.  A BMI of 18.5 to 24.9 is normal.  A BMI of 25 to 29.9 is considered overweight.  A  BMI of 30 and above is considered obese.  Maintain normal blood lipids and cholesterol levels by exercising and minimizing your intake of saturated fat. Eat a balanced diet with plenty of fruit and vegetables. Blood tests for lipids and cholesterol should begin at age 45 and be repeated every 5 years. If your lipid or cholesterol levels are high, you are over 50, or you are at high risk for heart disease, you may need your cholesterol levels checked more frequently.Ongoing high lipid and cholesterol levels should be treated with medicines if diet and exercise are not working.  If you smoke, find out from your health care provider how to quit. If you do not use tobacco, do not start.  Lung cancer screening is recommended for adults aged 45-80 years who are at high risk for developing lung cancer because of a history of smoking. A yearly low-dose CT scan of the lungs is recommended for people who have at least a 30-pack-year history of smoking and are a current smoker or have quit within the past 15 years. A pack year of smoking is smoking an average of 1 pack of cigarettes a day for 1 year (for example: 1 pack a day for 30 years or 2 packs a day for 15 years). Yearly screening should continue until the smoker has stopped smoking for at least 15 years. Yearly screening should be stopped for people who develop a health problem that would prevent them from having lung cancer treatment.  If you are pregnant, do not drink alcohol. If you are  breastfeeding, be very cautious about drinking alcohol. If you are not pregnant and choose to drink alcohol, do not have more than 1 drink per day. One drink is considered to be 12 ounces (355 mL) of beer, 5 ounces (148 mL) of wine, or 1.5 ounces (44 mL) of liquor.  Avoid use of street drugs. Do not share needles with anyone. Ask for help if you need support or instructions about stopping the use of drugs.  High blood pressure causes heart disease and increases the risk  of stroke. Your blood pressure should be checked at least every 1 to 2 years. Ongoing high blood pressure should be treated with medicines if weight loss and exercise do not work.  If you are 55-79 years old, ask your health care provider if you should take aspirin to prevent strokes.  Diabetes screening is done by taking a blood sample to check your blood glucose level after you have not eaten for a certain period of time (fasting). If you are not overweight and you do not have risk factors for diabetes, you should be screened once every 3 years starting at age 45. If you are overweight or obese and you are 40-70 years of age, you should be screened for diabetes every year as part of your cardiovascular risk assessment.  Breast cancer screening is essential preventive care for women. You should practice "breast self-awareness." This means understanding the normal appearance and feel of your breasts and may include breast self-examination. Any changes detected, no matter how small, should be reported to a health care provider. Women in their 20s and 30s should have a clinical breast exam (CBE) by a health care provider as part of a regular health exam every 1 to 3 years. After age 40, women should have a CBE every year. Starting at age 40, women should consider having a mammogram (breast X-ray test) every year. Women who have a family history of breast cancer should talk to their health care provider about genetic screening. Women at a high risk of breast cancer should talk to their health care providers about having an MRI and a mammogram every year.  Breast cancer gene (BRCA)-related cancer risk assessment is recommended for women who have family members with BRCA-related cancers. BRCA-related cancers include breast, ovarian, tubal, and peritoneal cancers. Having family members with these cancers may be associated with an increased risk for harmful changes (mutations) in the breast cancer genes BRCA1 and  BRCA2. Results of the assessment will determine the need for genetic counseling and BRCA1 and BRCA2 testing.  Your health care provider may recommend that you be screened regularly for cancer of the pelvic organs (ovaries, uterus, and vagina). This screening involves a pelvic examination, including checking for microscopic changes to the surface of your cervix (Pap test). You may be encouraged to have this screening done every 3 years, beginning at age 21.  For women ages 30-65, health care providers may recommend pelvic exams and Pap testing every 3 years, or they may recommend the Pap and pelvic exam, combined with testing for human papilloma virus (HPV), every 5 years. Some types of HPV increase your risk of cervical cancer. Testing for HPV may also be done on women of any age with unclear Pap test results.  Other health care providers may not recommend any screening for nonpregnant women who are considered low risk for pelvic cancer and who do not have symptoms. Ask your health care provider if a screening pelvic exam is right for   you.  If you have had past treatment for cervical cancer or a condition that could lead to cancer, you need Pap tests and screening for cancer for at least 20 years after your treatment. If Pap tests have been discontinued, your risk factors (such as having a new sexual partner) need to be reassessed to determine if screening should resume. Some women have medical problems that increase the chance of getting cervical cancer. In these cases, your health care provider may recommend more frequent screening and Pap tests.  Colorectal cancer can be detected and often prevented. Most routine colorectal cancer screening begins at the age of 50 years and continues through age 75 years. However, your health care provider may recommend screening at an earlier age if you have risk factors for colon cancer. On a yearly basis, your health care provider may provide home test kits to check  for hidden blood in the stool. Use of a small camera at the end of a tube, to directly examine the colon (sigmoidoscopy or colonoscopy), can detect the earliest forms of colorectal cancer. Talk to your health care provider about this at age 50, when routine screening begins. Direct exam of the colon should be repeated every 5-10 years through age 75 years, unless early forms of precancerous polyps or small growths are found.  People who are at an increased risk for hepatitis B should be screened for this virus. You are considered at high risk for hepatitis B if:  You were born in a country where hepatitis B occurs often. Talk with your health care provider about which countries are considered high risk.  Your parents were born in a high-risk country and you have not received a shot to protect against hepatitis B (hepatitis B vaccine).  You have HIV or AIDS.  You use needles to inject street drugs.  You live with, or have sex with, someone who has hepatitis B.  You get hemodialysis treatment.  You take certain medicines for conditions like cancer, organ transplantation, and autoimmune conditions.  Hepatitis C blood testing is recommended for all people born from 1945 through 1965 and any individual with known risks for hepatitis C.  Practice safe sex. Use condoms and avoid high-risk sexual practices to reduce the spread of sexually transmitted infections (STIs). STIs include gonorrhea, chlamydia, syphilis, trichomonas, herpes, HPV, and human immunodeficiency virus (HIV). Herpes, HIV, and HPV are viral illnesses that have no cure. They can result in disability, cancer, and death.  You should be screened for sexually transmitted illnesses (STIs) including gonorrhea and chlamydia if:  You are sexually active and are younger than 24 years.  You are older than 24 years and your health care provider tells you that you are at risk for this type of infection.  Your sexual activity has changed  since you were last screened and you are at an increased risk for chlamydia or gonorrhea. Ask your health care provider if you are at risk.  If you are at risk of being infected with HIV, it is recommended that you take a prescription medicine daily to prevent HIV infection. This is called preexposure prophylaxis (PrEP). You are considered at risk if:  You are sexually active and do not regularly use condoms or know the HIV status of your partner(s).  You take drugs by injection.  You are sexually active with a partner who has HIV.  Talk with your health care provider about whether you are at high risk of being infected with HIV. If   you choose to begin PrEP, you should first be tested for HIV. You should then be tested every 3 months for as long as you are taking PrEP.  Osteoporosis is a disease in which the bones lose minerals and strength with aging. This can result in serious bone fractures or breaks. The risk of osteoporosis can be identified using a bone density scan. Women ages 67 years and over and women at risk for fractures or osteoporosis should discuss screening with their health care providers. Ask your health care provider whether you should take a calcium supplement or vitamin D to reduce the rate of osteoporosis.  Menopause can be associated with physical symptoms and risks. Hormone replacement therapy is available to decrease symptoms and risks. You should talk to your health care provider about whether hormone replacement therapy is right for you.  Use sunscreen. Apply sunscreen liberally and repeatedly throughout the day. You should seek shade when your shadow is shorter than you. Protect yourself by wearing long sleeves, pants, a wide-brimmed hat, and sunglasses year round, whenever you are outdoors.  Once a month, do a whole body skin exam, using a mirror to look at the skin on your back. Tell your health care provider of new moles, moles that have irregular borders, moles that  are larger than a pencil eraser, or moles that have changed in shape or color.  Stay current with required vaccines (immunizations).  Influenza vaccine. All adults should be immunized every year.  Tetanus, diphtheria, and acellular pertussis (Td, Tdap) vaccine. Pregnant women should receive 1 dose of Tdap vaccine during each pregnancy. The dose should be obtained regardless of the length of time since the last dose. Immunization is preferred during the 27th-36th week of gestation. An adult who has not previously received Tdap or who does not know her vaccine status should receive 1 dose of Tdap. This initial dose should be followed by tetanus and diphtheria toxoids (Td) booster doses every 10 years. Adults with an unknown or incomplete history of completing a 3-dose immunization series with Td-containing vaccines should begin or complete a primary immunization series including a Tdap dose. Adults should receive a Td booster every 10 years.  Varicella vaccine. An adult without evidence of immunity to varicella should receive 2 doses or a second dose if she has previously received 1 dose. Pregnant females who do not have evidence of immunity should receive the first dose after pregnancy. This first dose should be obtained before leaving the health care facility. The second dose should be obtained 4-8 weeks after the first dose.  Human papillomavirus (HPV) vaccine. Females aged 13-26 years who have not received the vaccine previously should obtain the 3-dose series. The vaccine is not recommended for use in pregnant females. However, pregnancy testing is not needed before receiving a dose. If a female is found to be pregnant after receiving a dose, no treatment is needed. In that case, the remaining doses should be delayed until after the pregnancy. Immunization is recommended for any person with an immunocompromised condition through the age of 61 years if she did not get any or all doses earlier. During the  3-dose series, the second dose should be obtained 4-8 weeks after the first dose. The third dose should be obtained 24 weeks after the first dose and 16 weeks after the second dose.  Zoster vaccine. One dose is recommended for adults aged 30 years or older unless certain conditions are present.  Measles, mumps, and rubella (MMR) vaccine. Adults born  before 1957 generally are considered immune to measles and mumps. Adults born in 1957 or later should have 1 or more doses of MMR vaccine unless there is a contraindication to the vaccine or there is laboratory evidence of immunity to each of the three diseases. A routine second dose of MMR vaccine should be obtained at least 28 days after the first dose for students attending postsecondary schools, health care workers, or international travelers. People who received inactivated measles vaccine or an unknown type of measles vaccine during 1963-1967 should receive 2 doses of MMR vaccine. People who received inactivated mumps vaccine or an unknown type of mumps vaccine before 1979 and are at high risk for mumps infection should consider immunization with 2 doses of MMR vaccine. For females of childbearing age, rubella immunity should be determined. If there is no evidence of immunity, females who are not pregnant should be vaccinated. If there is no evidence of immunity, females who are pregnant should delay immunization until after pregnancy. Unvaccinated health care workers born before 1957 who lack laboratory evidence of measles, mumps, or rubella immunity or laboratory confirmation of disease should consider measles and mumps immunization with 2 doses of MMR vaccine or rubella immunization with 1 dose of MMR vaccine.  Pneumococcal 13-valent conjugate (PCV13) vaccine. When indicated, a person who is uncertain of his immunization history and has no record of immunization should receive the PCV13 vaccine. All adults 65 years of age and older should receive this  vaccine. An adult aged 19 years or older who has certain medical conditions and has not been previously immunized should receive 1 dose of PCV13 vaccine. This PCV13 should be followed with a dose of pneumococcal polysaccharide (PPSV23) vaccine. Adults who are at high risk for pneumococcal disease should obtain the PPSV23 vaccine at least 8 weeks after the dose of PCV13 vaccine. Adults older than 25 years of age who have normal immune system function should obtain the PPSV23 vaccine dose at least 1 year after the dose of PCV13 vaccine.  Pneumococcal polysaccharide (PPSV23) vaccine. When PCV13 is also indicated, PCV13 should be obtained first. All adults aged 65 years and older should be immunized. An adult younger than age 65 years who has certain medical conditions should be immunized. Any person who resides in a nursing home or long-term care facility should be immunized. An adult smoker should be immunized. People with an immunocompromised condition and certain other conditions should receive both PCV13 and PPSV23 vaccines. People with human immunodeficiency virus (HIV) infection should be immunized as soon as possible after diagnosis. Immunization during chemotherapy or radiation therapy should be avoided. Routine use of PPSV23 vaccine is not recommended for American Indians, [place] Natives, or people younger than 65 years unless there are medical conditions that require PPSV23 vaccine. When indicated, people who have unknown immunization and have no record of immunization should receive PPSV23 vaccine. One-time revaccination 5 years after the first dose of PPSV23 is recommended for people aged 19-64 years who have chronic kidney failure, nephrotic syndrome, asplenia, or immunocompromised conditions. People who received 1-2 doses of PPSV23 before age 65 years should receive another dose of PPSV23 vaccine at age 65 years or later if at least 5 years have passed since the previous dose. Doses of PPSV23 are not  needed for people immunized with PPSV23 at or after age 65 years.  Meningococcal vaccine. Adults with asplenia or persistent complement component deficiencies should receive 2 doses of quadrivalent meningococcal conjugate (MenACWY-D) vaccine. The doses should be obtained   at least 2 months apart. Microbiologists working with certain meningococcal bacteria, [place] recruits, people at risk during an outbreak, and people who travel to or live in countries with a high rate of meningitis should be immunized. A first-year college student up through age 34 years who is living in a residence hall should receive a dose if she did not receive a dose on or after her 16th birthday. Adults who have certain high-risk conditions should receive one or more doses of vaccine.  Hepatitis A vaccine. Adults who wish to be protected from this disease, have certain high-risk conditions, work with hepatitis A-infected animals, work in hepatitis A research labs, or travel to or work in countries with a high rate of hepatitis A should be immunized. Adults who were previously unvaccinated and who anticipate close contact with an international adoptee during the first 60 days after arrival in the [place] from a country with a high rate of hepatitis A should be immunized.  Hepatitis B vaccine. Adults who wish to be protected from this disease, have certain high-risk conditions, may be exposed to blood or other infectious body fluids, are household contacts or sex partners of hepatitis B positive people, are clients or workers in certain care facilities, or travel to or work in countries with a high rate of hepatitis B should be immunized.  Haemophilus influenzae type b (Hib) vaccine. A previously unvaccinated person with asplenia or sickle cell disease or having a scheduled splenectomy should receive 1 dose of Hib vaccine. Regardless of previous immunization, a recipient of a hematopoietic stem cell transplant should receive a  3-dose series 6-12 months after her successful transplant. Hib vaccine is not recommended for adults with HIV infection. Preventive Services / Frequency Ages 35 to 4 years  Blood pressure check.** / Every 3-5 years.  Lipid and cholesterol check.** / Every 5 years beginning at age 60.  Clinical breast exam.** / Every 3 years for women in their 71s and 10s.  BRCA-related cancer risk assessment.** / For women who have family members with a BRCA-related cancer (breast, ovarian, tubal, or peritoneal cancers).  Pap test.** / Every 2 years from ages 76 through 26. Every 3 years starting at age 61 through age 76 or 93 with a history of 3 consecutive normal Pap tests.  HPV screening.** / Every 3 years from ages 37 through ages 60 to 51 with a history of 3 consecutive normal Pap tests.  Hepatitis C blood test.** / For any individual with known risks for hepatitis C.  Skin self-exam. / Monthly.  Influenza vaccine. / Every year.  Tetanus, diphtheria, and acellular pertussis (Tdap, Td) vaccine.** / Consult your health care provider. Pregnant women should receive 1 dose of Tdap vaccine during each pregnancy. 1 dose of Td every 10 years.  Varicella vaccine.** / Consult your health care provider. Pregnant females who do not have evidence of immunity should receive the first dose after pregnancy.  HPV vaccine. / 3 doses over 6 months, if 93 and younger. The vaccine is not recommended for use in pregnant females. However, pregnancy testing is not needed before receiving a dose.  Measles, mumps, rubella (MMR) vaccine.** / You need at least 1 dose of MMR if you were born in 1957 or later. You may also need a 2nd dose. For females of childbearing age, rubella immunity should be determined. If there is no evidence of immunity, females who are not pregnant should be vaccinated. If there is no evidence of immunity, females who are  pregnant should delay immunization until after pregnancy.  Pneumococcal  13-valent conjugate (PCV13) vaccine.** / Consult your health care provider.  Pneumococcal polysaccharide (PPSV23) vaccine.** / 1 to 2 doses if you smoke cigarettes or if you have certain conditions.  Meningococcal vaccine.** / 1 dose if you are age 68 to 8 years and a Market researcher living in a residence hall, or have one of several medical conditions, you need to get vaccinated against meningococcal disease. You may also need additional booster doses.  Hepatitis A vaccine.** / Consult your health care provider.  Hepatitis B vaccine.** / Consult your health care provider.  Haemophilus influenzae type b (Hib) vaccine.** / Consult your health care provider. Ages 7 to 53 years  Blood pressure check.** / Every year.  Lipid and cholesterol check.** / Every 5 years beginning at age 25 years.  Lung cancer screening. / Every year if you are aged 11-80 years and have a 30-pack-year history of smoking and currently smoke or have quit within the past 15 years. Yearly screening is stopped once you have quit smoking for at least 15 years or develop a health problem that would prevent you from having lung cancer treatment.  Clinical breast exam.** / Every year after age 48 years.  BRCA-related cancer risk assessment.** / For women who have family members with a BRCA-related cancer (breast, ovarian, tubal, or peritoneal cancers).  Mammogram.** / Every year beginning at age 41 years and continuing for as long as you are in good health. Consult with your health care provider.  Pap test.** / Every 3 years starting at age 65 years through age 37 or 70 years with a history of 3 consecutive normal Pap tests.  HPV screening.** / Every 3 years from ages 72 years through ages 60 to 40 years with a history of 3 consecutive normal Pap tests.  Fecal occult blood test (FOBT) of stool. / Every year beginning at age 21 years and continuing until age 5 years. You may not need to do this test if you get  a colonoscopy every 10 years.  Flexible sigmoidoscopy or colonoscopy.** / Every 5 years for a flexible sigmoidoscopy or every 10 years for a colonoscopy beginning at age 35 years and continuing until age 48 years.  Hepatitis C blood test.** / For all people born from [birth year] through 1965 and any individual with known risks for hepatitis C.  Skin self-exam. / Monthly.  Influenza vaccine. / Every year.  Tetanus, diphtheria, and acellular pertussis (Tdap/Td) vaccine.** / Consult your health care provider. Pregnant women should receive 1 dose of Tdap vaccine during each pregnancy. 1 dose of Td every 10 years.  Varicella vaccine.** / Consult your health care provider. Pregnant females who do not have evidence of immunity should receive the first dose after pregnancy.  Zoster vaccine.** / 1 dose for adults aged 30 years or older.  Measles, mumps, rubella (MMR) vaccine.** / You need at least 1 dose of MMR if you were born in 1957 or later. You may also need a second dose. For females of childbearing age, rubella immunity should be determined. If there is no evidence of immunity, females who are not pregnant should be vaccinated. If there is no evidence of immunity, females who are pregnant should delay immunization until after pregnancy.  Pneumococcal 13-valent conjugate (PCV13) vaccine.** / Consult your health care provider.  Pneumococcal polysaccharide (PPSV23) vaccine.** / 1 to 2 doses if you smoke cigarettes or if you have certain conditions.  Meningococcal vaccine.** /  Consult your health care provider.  Hepatitis A vaccine.** / Consult your health care provider.  Hepatitis B vaccine.** / Consult your health care provider.  Haemophilus influenzae type b (Hib) vaccine.** / Consult your health care provider. Ages 64 years and over  Blood pressure check.** / Every year.  Lipid and cholesterol check.** / Every 5 years beginning at age 23 years.  Lung cancer screening. / Every year if you  are aged 16-80 years and have a 30-pack-year history of smoking and currently smoke or have quit within the past 15 years. Yearly screening is stopped once you have quit smoking for at least 15 years or develop a health problem that would prevent you from having lung cancer treatment.  Clinical breast exam.** / Every year after age 74 years.  BRCA-related cancer risk assessment.** / For women who have family members with a BRCA-related cancer (breast, ovarian, tubal, or peritoneal cancers).  Mammogram.** / Every year beginning at age 44 years and continuing for as long as you are in good health. Consult with your health care provider.  Pap test.** / Every 3 years starting at age 58 years through age 22 or 39 years with 3 consecutive normal Pap tests. Testing can be stopped between 65 and 70 years with 3 consecutive normal Pap tests and no abnormal Pap or HPV tests in the past 10 years.  HPV screening.** / Every 3 years from ages 64 years through ages 70 or 61 years with a history of 3 consecutive normal Pap tests. Testing can be stopped between 65 and 70 years with 3 consecutive normal Pap tests and no abnormal Pap or HPV tests in the past 10 years.  Fecal occult blood test (FOBT) of stool. / Every year beginning at age 40 years and continuing until age 27 years. You may not need to do this test if you get a colonoscopy every 10 years.  Flexible sigmoidoscopy or colonoscopy.** / Every 5 years for a flexible sigmoidoscopy or every 10 years for a colonoscopy beginning at age 7 years and continuing until age 32 years.  Hepatitis C blood test.** / For all people born from [birth year] through 1965 and any individual with known risks for hepatitis C.  Osteoporosis screening.** / A one-time screening for women ages 30 years and over and women at risk for fractures or osteoporosis.  Skin self-exam. / Monthly.  Influenza vaccine. / Every year.  Tetanus, diphtheria, and acellular pertussis (Tdap/Td)  vaccine.** / 1 dose of Td every 10 years.  Varicella vaccine.** / Consult your health care provider.  Zoster vaccine.** / 1 dose for adults aged 35 years or older.  Pneumococcal 13-valent conjugate (PCV13) vaccine.** / Consult your health care provider.  Pneumococcal polysaccharide (PPSV23) vaccine.** / 1 dose for all adults aged 46 years and older.  Meningococcal vaccine.** / Consult your health care provider.  Hepatitis A vaccine.** / Consult your health care provider.  Hepatitis B vaccine.** / Consult your health care provider.  Haemophilus influenzae type b (Hib) vaccine.** / Consult your health care provider. ** Family history and personal history of risk and conditions may change your health care provider's recommendations.   This information is not intended to replace advice given to you by your health care provider. Make sure you discuss any questions you have with your health care provider.   Document Released: 06/24/2001 Document Revised: 05/19/2014 Document Reviewed: 09/23/2010 Elsevier Interactive Patient Education [employer].

## 2015-09-14 NOTE — Progress Notes (Signed)
Pre visit review using our clinic review tool, if applicable. No additional management support is needed unless otherwise documented below in the visit note. 

## 2015-09-14 NOTE — Progress Notes (Signed)
Subjective:     Susan Craig is a 25 y.o. female and is here for a comprehensive physical exam. The patient reports no problems. Pt is [redacted] weeks pregnant.  Pt seeing [facility].    Social History   Social History  . Marital Status: Married    Spouse Name: N/A  . Number of Children: N/A  . Years of Education: N/A   Occupational History  . nurse [employer]   Social History Main Topics  . Smoking status: Never Smoker   . Smokeless tobacco: Not on file  . Alcohol Use: Yes     Comment: Occasionally  . Drug Use: No  . Sexual Activity:    Partners: Male    Birth Control/ Protection: Pill   Other Topics Concern  . Not on file   Social History Narrative   ** Merged History Encounter **       Health Maintenance  Topic Date Due  . HIV Screening  09/13/2016 (Originally 05/14/2005)  . INFLUENZA VACCINE  12/11/2015  . PAP SMEAR  09/11/2017  . TETANUS/TDAP  12/11/2022    The following portions of the patient's history were reviewed and updated as appropriate:  She  has a past medical history of Chicken pox and Anxiety. She  does not have any pertinent problems on file. She  has past surgical history that includes Tonsillectomy (2009) and Nose surgery. Her family history includes Anxiety disorder in her mother and sister; Arthritis in her paternal grandmother; COPD in her maternal grandfather; Cancer in her paternal grandmother; Depression in her father and paternal grandmother; Heart disease in her paternal grandfather; High blood pressure in her paternal grandfather; Lung cancer in her maternal grandmother. She  reports that she has never smoked. She does not have any smokeless tobacco history on file. She reports that she drinks alcohol. She reports that she does not use illicit drugs. She has a current medication list which includes the following prescription(s): doxylamine-pyridoxine, prenatal multivitamin, sertraline, acetazolamide, and acetazolamide. Current Outpatient  Prescriptions on File Prior to Visit  Medication Sig Dispense Refill  . Doxylamine-Pyridoxine (DICLEGIS) 10-10 MG TBEC Take by mouth. Takes two pills at night    . Prenatal Vit-Fe Fumarate-FA (PRENATAL MULTIVITAMIN) TABS tablet Take 1 tablet by mouth daily at 12 noon.    . sertraline (ZOLOFT) 25 MG tablet   5  . acetaZOLAMIDE (DIAMOX) 250 MG tablet  bid for 5 days and then  bid for 5 days. (Patient not taking: Reported on 09/14/2015) 15 tablet 0  . [START ON 09/23/2015] acetaZOLAMIDE (DIAMOX) 500 MG capsule Take 1 capsule (500 mg total) by mouth 2 (two) times daily. (Patient not taking: Reported on 09/14/2015) 60 capsule 2   No current facility-administered medications on file prior to visit.   She has No Known Allergies..  Review of Systems Review of Systems  Constitutional: Negative for activity change, appetite change and fatigue.  HENT: Negative for hearing loss, congestion, tinnitus and ear discharge.  dentist q15m Eyes: Negative for visual disturbance (see optho q1y -- vision corrected to 20/20 with glasses).  Respiratory: Negative for cough, chest tightness and shortness of breath.   Cardiovascular: Negative for chest pain, palpitations and leg swelling.  Gastrointestinal: Negative for abdominal pain, diarrhea, constipation and abdominal distention.  Genitourinary: Negative for urgency, frequency, decreased urine volume and difficulty urinating.  Musculoskeletal: Negative for back pain, arthralgias and gait problem.  Skin: Negative for color change, pallor and rash.  Neurological: Negative for dizziness, light-headedness, numbness and  headaches.  Hematological: Negative for adenopathy. Does not bruise/bleed easily.  Psychiatric/Behavioral: Negative for suicidal ideas, confusion, sleep disturbance, self-injury, dysphoric mood, decreased concentration and agitation.       Objective:   BP 108/60 mmHg  Pulse 74  Temp(Src) 98.4 F (36.9 C) (Oral)  Ht 5\' 5"  (1.651 m)  Wt 199  lb 3.2 oz (90.357 kg)  BMI 33.15 kg/m2  SpO2 99%  LMP 05/15/2015 General appearance: alert, cooperative, appears stated age and no distress Head: Normocephalic, without obvious abnormality, atraumatic Eyes: conjunctivae/corneas clear. PERRL, EOM's intact. Fundi benign. Ears: normal TM's and external ear canals both ears Nose: Nares normal. Septum midline. Mucosa normal. No drainage or sinus tenderness. Throat: lips, mucosa, and tongue normal; teeth and gums normal Neck: no adenopathy, no carotid bruit, no JVD, supple, symmetrical, trachea midline and thyroid not enlarged, symmetric, no tenderness/mass/nodules Back: symmetric, no curvature. ROM normal. No CVA tenderness. Lungs: clear to auscultation bilaterally Breasts: normal appearance, no masses or tenderness Heart: regular rate and rhythm, S1, S2 normal, no murmur, click, rub or gallop Abdomen: soft, non-tender; bowel sounds normal; no masses,  no organomegaly Pelvic: deferred Extremities: extremities normal, atraumatic, no cyanosis or edema Pulses: 2+ and symmetric Skin: Skin color, texture, turgor normal. No rashes or lesions Lymph nodes: Cervical, supraclavicular, and axillary nodes normal. Neurologic: Alert and oriented X 3, normal strength and tone. Normal symmetric reflexes. Normal coordination and gait Assessment:    Healthy female exam.      Plan:    check labs ghm utd See After Visit Summary for Counseling Recommendations

## 2015-09-14 NOTE — Telephone Encounter (Signed)
Pt returning phone to speak to Dr. Roda ShuttersXu.  [phone]

## 2015-09-17 ENCOUNTER — Telehealth: Payer: Self-pay | Admitting: *Deleted

## 2015-09-17 DIAGNOSIS — Z36 Encounter for antenatal screening of mother: Secondary | ICD-10-CM | POA: Diagnosis not present

## 2015-09-17 NOTE — Telephone Encounter (Signed)
LFt vm for [facility] at [facility] that patient was last seen in January 2017. Also left vm that the instructions are on the med list not office notes.

## 2015-09-17 NOTE — Telephone Encounter (Signed)
[name] from [facility], return phone call at this number. [phone] ext [phone]

## 2015-09-17 NOTE — Telephone Encounter (Signed)
[name]/ [phone] called. Said Dr Rosemary Holmsavon is wanting the instructions for Diamox and office notes.

## 2015-09-18 NOTE — Telephone Encounter (Signed)
Rn call Baxter HireKristen at [facility]. Loyola MastKristen Rn stated the obgyn needs the instructions for the diamox. Rn fax to [phone]. Fax was confirm.

## 2015-09-18 NOTE — Telephone Encounter (Signed)
Baxter HireKristen from [facility] called please call [phone] ext: [phone]

## 2015-09-18 NOTE — Telephone Encounter (Signed)
LFt vm for Baxter HireKristen Rn at [facility].

## 2015-09-20 MED FILL — SERTRALINE HCL 25 MG TABLET: 25 | 30 days supply | Qty: 60 | Fill #2

## 2015-10-11 DIAGNOSIS — H4710 Unspecified papilledema: Secondary | ICD-10-CM | POA: Diagnosis not present

## 2015-10-15 DIAGNOSIS — O365920 Maternal care for other known or suspected poor fetal growth, second trimester, not applicable or unspecified: Secondary | ICD-10-CM | POA: Diagnosis not present

## 2015-10-15 DIAGNOSIS — Z3A22 22 weeks gestation of pregnancy: Secondary | ICD-10-CM | POA: Diagnosis not present

## 2015-10-17 ENCOUNTER — Encounter: Payer: Self-pay | Admitting: Family Medicine

## 2015-10-19 MED FILL — SERTRALINE HCL 25 MG TABLET: 25 | 30 days supply | Qty: 60 | Fill #3

## 2015-10-24 MED FILL — ACETAZOLAMIDE ER 500 MG CAP: 500 | 30 days supply | Qty: 60 | Fill #1

## 2015-11-12 ENCOUNTER — Encounter: Payer: Self-pay | Admitting: Neurology

## 2015-11-12 ENCOUNTER — Ambulatory Visit (INDEPENDENT_AMBULATORY_CARE_PROVIDER_SITE_OTHER): Payer: 59 | Admitting: Neurology

## 2015-11-12 ENCOUNTER — Other Ambulatory Visit (INDEPENDENT_AMBULATORY_CARE_PROVIDER_SITE_OTHER): Payer: 59

## 2015-11-12 VITALS — BP 118/74 | HR 76 | Ht 65.0 in | Wt 199.3 lb

## 2015-11-12 DIAGNOSIS — G932 Benign intracranial hypertension: Secondary | ICD-10-CM

## 2015-11-12 LAB — BASIC METABOLIC PANEL
BUN: 10 mg/dL (ref 6–23)
CHLORIDE: 108 meq/L (ref 96–112)
CO2: 19 mEq/L (ref 19–32)
Calcium: 9 mg/dL (ref 8.4–10.5)
Creatinine, Ser: 0.64 mg/dL (ref 0.40–1.20)
GFR: 119.69 mL/min (ref >60.00)
Glucose, Bld: 86 mg/dL (ref 70–99)
POTASSIUM: 3.5 meq/L (ref 3.5–5.1)
SODIUM: 135 meq/L (ref 135–145)

## 2015-11-12 NOTE — Patient Instructions (Signed)
1.  Continue acetazolamide 500mg  twice daily.  We will see how your next eye exam looks to determine if we need to change dose. 2.  Will check BMP 3.  Follow up in 3 months.

## 2015-11-12 NOTE — Progress Notes (Addendum)
NEUROLOGY CONSULTATION NOTE  Harvel RicksHayley A Washington MRN: [id number] DOB: 1990-09-13  Referring provider: Dr. Alben SpittleWeaver (ophthalmology) Primary care provider: Dr. Laury AxonLowne  Reason for consult:  Papilledema, IIH  HISTORY OF PRESENT ILLNESS: Susan Craig is a 25 year old right-handed female with anxiety who is [redacted] weeks pregnant presents for idiopathic intracranial hypertension.  History obtained by patient her husband who accompanies her, prior neurologist's note and ophthalmology note.    In January 2017, she started experiencing daily headaches.  She also reported blurred vision, worse in the right eye.  She saw an ophthalmologist who found her to have bilateral papilledema.    MRI of the brain and orbits with and without contrast from 05/31/15 was personally reviewed and were unremarkable.  She had a MRV of the head on 06/06/15, which was personally reviewed and revealed slightly hypoplastic left transverse sinus but no cerebral venous stenosis or thrombosis.  She underwent a lumbar puncture at that time, which revealed an opening pressure of 31 cm H2O.  CSF cell count, protein and glucose were unremarkable.  She was pregnant at that time and was advised not to initiate acetazolamide.  She continued to have headaches and persistent papilledema.  She underwent a therapeutic lumbar puncture on 08/01/15.  Opening pressure was 32 cm H2O.  Symptoms persisted and she was cleared by her obstetrician to start acetazolamide.  She underwent repeat ophthalmologic evaluation on 09/11/15, which still revealed bilateral papilledema with worsening retinal nerve fiber layer thickness. She started on acetazolamide 500mg  twice daily on 09/18/15.  She followed up with ophthalmology on 10/11/15, which showed bilateral blindspot enlargement.  Headaches and clinical visual disturbance have since resolved.    PAST MEDICAL HISTORY: Past Medical History  Diagnosis Date  . Chicken pox   . Anxiety     PAST SURGICAL HISTORY: Past  Surgical History  Procedure Laterality Date  . Tonsillectomy  2009  . Nose surgery      septoplasty x2 and revision x1--- [facility]    MEDICATIONS: Current Outpatient Prescriptions on File Prior to Visit  Medication Sig Dispense Refill  . acetaZOLAMIDE (DIAMOX) 500 MG capsule Take 1 capsule (500 mg total) by mouth 2 (two) times daily. 60 capsule 2  . Prenatal Vit-Fe Fumarate-FA (PRENATAL MULTIVITAMIN) TABS tablet Take 1 tablet by mouth daily at 12 noon.    . sertraline (ZOLOFT) 25 MG tablet   5   No current facility-administered medications on file prior to visit.    ALLERGIES: No Known Allergies  FAMILY HISTORY: Family History  Problem Relation Age of Onset  . Arthritis Paternal Grandmother   . Depression Paternal Grandmother   . Cancer Paternal Grandmother     pancreatic  . Lung cancer Maternal Grandmother     Smoker  . High blood pressure Paternal Grandfather   . Heart disease Paternal Grandfather     a fib-- cabg  . Depression Father   . Anxiety disorder Mother   . Anxiety disorder Sister   . COPD Maternal Grandfather     SOCIAL HISTORY: Social History   Social History  . Marital Status: Married    Spouse Name: N/A  . Number of Children: N/A  . Years of Education: N/A   Occupational History  . nurse [employer]   Social History Main Topics  . Smoking status: Never Smoker   . Smokeless tobacco: Never Used  . Alcohol Use: No     Comment: Occasionally  . Drug Use: No  . Sexual Activity:  Partners: Male    Birth Control/ Protection: Pill   Other Topics Concern  . Not on file   Social History Narrative   Lives with husband in a 2 story home.  Currently pregnant.  No other children.  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          REVIEW OF SYSTEMS: Constitutional: No fevers, chills, or sweats, no generalized fatigue, change in appetite Eyes: No visual changes, double vision, eye pain Ear, nose and throat: No hearing loss,  ear pain, nasal congestion, sore throat Cardiovascular: No chest pain, palpitations Respiratory:  No shortness of breath at rest or with exertion, wheezes GastrointestinaI: No nausea, vomiting, diarrhea, abdominal pain, fecal incontinence Genitourinary:  No dysuria, urinary retention or frequency Musculoskeletal:  No neck pain, back pain Integumentary: No rash, pruritus, skin lesions Neurological: as above Psychiatric: No depression, insomnia, anxiety Endocrine: No palpitations, fatigue, diaphoresis, mood swings, change in appetite, change in weight, increased thirst Hematologic/Lymphatic:  No purpura, petechiae. Allergic/Immunologic: no itchy/runny eyes, nasal congestion, recent allergic reactions, rashes  PHYSICAL EXAM: Filed Vitals:   11/12/15 0942  BP: 118/74  Pulse: 76   General: No acute distress.  Patient appears well-groomed.  Head:  Normocephalic/atraumatic Eyes:  fundi examined but not visualized Neck: supple, no paraspinal tenderness, full range of motion Back: No paraspinal tenderness Heart: regular rate and rhythm Lungs: Clear to auscultation bilaterally. Vascular: No carotid bruits. Neurological Exam: Mental status: alert and oriented to person, place, and time, recent and remote memory intact, fund of knowledge intact, attention and concentration intact, speech fluent and not dysarthric, language intact. Cranial nerves: CN I: not tested CN II: pupils equal, round and reactive to light, visual fields intact CN III, IV, VI:  full range of motion, no nystagmus, no ptosis CN V: facial sensation intact CN VII: upper and lower face symmetric CN VIII: hearing intact CN IX, X: gag intact, uvula midline CN XI: sternocleidomastoid and trapezius muscles intact CN XII: tongue midline Bulk & Tone: normal, no fasciculations. Motor:  5/5 throughout Sensation: temperature and vibration sensation intact. Deep Tendon Reflexes:  2+ throughout, toes downgoing.  Finger to nose  testing:  Without dysmetria.  Heel to shin:  Without dysmetria.  Gait:  Normal station and stride.  Able to turn and tandem walk. Romberg negative.  IMPRESSION: Idiopathic intracranial hypertension.  Last eye exam a month ago suggested worsening papilledema, although she is clinically improved.  PLAN: 1.  Continue acetazolamide 500mg  twice daily.  A month ago, she had mildly worse papilledema, so we may need to increase dose.  She has a follow up with ophthalmology next week.  We will see what the updated exam findings show and make adjustment to dose if needed. 2.  Will check baseline BMP. 3.  Weight loss discussed 4.  Follow up in 3 months. Thank you for allowing me to take part in the care of this patient.  Shon MilletAdam Jaffe, DO  CC:  Loreen FreudYvonne Lowne, DO  Wynell Balloonhristopher Weaver, MD

## 2015-11-12 NOTE — Progress Notes (Signed)
Note routed

## 2015-11-16 DIAGNOSIS — O365920 Maternal care for other known or suspected poor fetal growth, second trimester, not applicable or unspecified: Secondary | ICD-10-CM | POA: Diagnosis not present

## 2015-11-16 DIAGNOSIS — Z36 Encounter for antenatal screening of mother: Secondary | ICD-10-CM | POA: Diagnosis not present

## 2015-11-16 DIAGNOSIS — Z3A27 27 weeks gestation of pregnancy: Secondary | ICD-10-CM | POA: Diagnosis not present

## 2015-11-16 DIAGNOSIS — Z23 Encounter for immunization: Secondary | ICD-10-CM | POA: Diagnosis not present

## 2015-11-19 DIAGNOSIS — O99810 Abnormal glucose complicating pregnancy: Secondary | ICD-10-CM | POA: Diagnosis not present

## 2015-11-19 DIAGNOSIS — Z3A27 27 weeks gestation of pregnancy: Secondary | ICD-10-CM | POA: Diagnosis not present

## 2015-11-21 MED FILL — SERTRALINE HCL 25 MG TABLET: 25 | 30 days supply | Qty: 60 | Fill #4

## 2015-11-21 MED FILL — ACETAZOLAMIDE ER 500 MG CAP: 500 | 30 days supply | Qty: 60 | Fill #2

## 2015-11-28 ENCOUNTER — Telehealth: Payer: Self-pay

## 2015-11-28 NOTE — Telephone Encounter (Signed)
-----   Message from Drema DallasAdam R Jaffe, DO sent at 11/28/2015  7:50 AM EDT ----- This patient was supposed to have seen Alben SpittleWeaver last week.  Can we contact his office for office note.

## 2015-11-28 NOTE — Telephone Encounter (Signed)
Fax requesting medical records sent to Dr. Alben SpittleWeaver.

## 2015-11-29 ENCOUNTER — Telehealth: Payer: Self-pay | Admitting: Neurology

## 2015-11-29 MED ORDER — ACETAZOLAMIDE 250 MG PO TABS: 750.0000 mg | ORAL_TABLET | Freq: Two times a day (BID) | ORAL | Status: DC

## 2015-11-29 MED FILL — acetaZOLAMIDE 250 MG TABS: 250 | 30 days supply | Qty: 180 | Fill #0

## 2015-11-29 NOTE — Telephone Encounter (Signed)
Received call-back from Ms. Lewinski.  She had to reschedule appointment with Dr. Alben SpittleWeaver.  In that case, I will go ahead and increase the acetazolamide from 500mg  twice daily to 750mg  twice daily and adjust dose from there pending her follow up eye exam.

## 2015-11-29 NOTE — Telephone Encounter (Signed)
Patient was supposed to follow up with ophthalmology last week.  I did not receive a note from the ophthalmologist.  When we contacted the office for the note, they sent over the old note from last month.  She has pseudotumor cerebri and I need to know the update regarding papilledema to determine if we need to increase the Diamox.  I called the patient and left a message for her to call us back so I can address this with her.

## 2015-12-07 DIAGNOSIS — H4710 Unspecified papilledema: Secondary | ICD-10-CM | POA: Diagnosis not present

## 2015-12-07 DIAGNOSIS — Z36 Encounter for antenatal screening of mother: Secondary | ICD-10-CM | POA: Diagnosis not present

## 2015-12-07 DIAGNOSIS — O99810 Abnormal glucose complicating pregnancy: Secondary | ICD-10-CM | POA: Diagnosis not present

## 2015-12-07 DIAGNOSIS — Z3A30 30 weeks gestation of pregnancy: Secondary | ICD-10-CM | POA: Diagnosis not present

## 2015-12-11 ENCOUNTER — Telehealth: Payer: Self-pay

## 2015-12-11 MED ORDER — ACETAZOLAMIDE 250 MG PO TABS: ORAL_TABLET | ORAL | 2 refills | Status: DC

## 2015-12-11 NOTE — Telephone Encounter (Signed)
-----   Message from Drema Dallas, DO sent at 12/11/2015  9:52 AM EDT ----- I spoke with Dr. Alben Spittle.  Her papilledema is improving.  Therefore, we can go down a little on the acetazolamide.  Instead of 750mg  twice daily, she can take 750mg  in AM and 500mg  in PM.

## 2015-12-11 NOTE — Telephone Encounter (Signed)
Left vm to let pt know that I had sent her a mychart message with info.

## 2015-12-20 MED FILL — SERTRALINE HCL 25 MG TABLET: 25 | 30 days supply | Qty: 60 | Fill #5

## 2016-01-02 MED FILL — acetaZOLAMIDE 250 MG TABS: 250 | 30 days supply | Qty: 180 | Fill #1

## 2016-01-17 DIAGNOSIS — O99810 Abnormal glucose complicating pregnancy: Secondary | ICD-10-CM | POA: Diagnosis not present

## 2016-01-17 DIAGNOSIS — Z3403 Encounter for supervision of normal first pregnancy, third trimester: Secondary | ICD-10-CM | POA: Diagnosis not present

## 2016-01-17 DIAGNOSIS — Z3A35 35 weeks gestation of pregnancy: Secondary | ICD-10-CM | POA: Diagnosis not present

## 2016-01-17 LAB — OB RESULTS CONSOLE GBS: STREP GROUP B AG: NEGATIVE

## 2016-01-22 MED FILL — SERTRALINE HCL 50 MG TABLET: 50 | 30 days supply | Qty: 30 | Fill #0

## 2016-01-25 DIAGNOSIS — H4710 Unspecified papilledema: Secondary | ICD-10-CM | POA: Diagnosis not present

## 2016-01-31 ENCOUNTER — Encounter (HOSPITAL_COMMUNITY): Payer: Self-pay

## 2016-01-31 ENCOUNTER — Inpatient Hospital Stay (HOSPITAL_COMMUNITY)
Admission: AD | Admit: 2016-01-31 | Discharge: 2016-02-03 | DRG: 775 | Disposition: A | Discharge status: Home / Self Care | Payer: 59 | Source: Ambulatory Visit | Attending: Obstetrics & Gynecology | Admitting: Obstetrics & Gynecology

## 2016-01-31 ENCOUNTER — Inpatient Hospital Stay (HOSPITAL_COMMUNITY): Payer: 59 | Admitting: Anesthesiology

## 2016-01-31 DIAGNOSIS — Z3A38 38 weeks gestation of pregnancy: Secondary | ICD-10-CM | POA: Diagnosis not present

## 2016-01-31 DIAGNOSIS — Z8249 Family history of ischemic heart disease and other diseases of the circulatory system: Secondary | ICD-10-CM

## 2016-01-31 DIAGNOSIS — D62 Acute posthemorrhagic anemia: Secondary | ICD-10-CM | POA: Diagnosis not present

## 2016-01-31 DIAGNOSIS — O4292 Full-term premature rupture of membranes, unspecified as to length of time between rupture and onset of labor: Secondary | ICD-10-CM | POA: Diagnosis present

## 2016-01-31 DIAGNOSIS — O9081 Anemia of the puerperium: Secondary | ICD-10-CM | POA: Diagnosis present

## 2016-01-31 DIAGNOSIS — G932 Benign intracranial hypertension: Secondary | ICD-10-CM | POA: Diagnosis present

## 2016-01-31 DIAGNOSIS — O99354 Diseases of the nervous system complicating childbirth: Secondary | ICD-10-CM | POA: Diagnosis present

## 2016-01-31 DIAGNOSIS — IMO0001 Reserved for inherently not codable concepts without codable children: Secondary | ICD-10-CM

## 2016-01-31 DIAGNOSIS — D72829 Elevated white blood cell count, unspecified: Secondary | ICD-10-CM | POA: Diagnosis present

## 2016-01-31 LAB — CBC
HCT: 34.2 % — ABNORMAL LOW (ref 36.0–46.0)
HEMOGLOBIN: 11.6 g/dL — AB (ref 12.0–15.0)
MCH: 29.1 pg (ref 26.0–34.0)
MCHC: 33.9 g/dL (ref 30.0–36.0)
MCV: 85.7 fL (ref 78.0–100.0)
Platelets: 291 10*3/uL (ref 150–400)
RBC: 3.99 MIL/uL (ref 3.87–5.11)
RDW: 13 % (ref 11.5–15.5)
WBC: 16.9 10*3/uL — ABNORMAL HIGH (ref 4.0–10.5)

## 2016-01-31 LAB — POCT FERN TEST: POCT FERN TEST: POSITIVE

## 2016-01-31 MED ORDER — OXYCODONE-ACETAMINOPHEN 5-325 MG PO TABS: 1.0000 | ORAL_TABLET | ORAL | Status: DC | PRN

## 2016-01-31 MED ORDER — OXYCODONE-ACETAMINOPHEN 5-325 MG PO TABS: 2.0000 | ORAL_TABLET | ORAL | Status: DC | PRN

## 2016-01-31 MED ORDER — FENTANYL 2.5 MCG/ML BUPIVACAINE 1/10 % EPIDURAL INFUSION (WH - ANES)
14.0000 mL/h | INTRAMUSCULAR | Status: DC | PRN
  Administered 2016-01-31 (×2): 14 mL/h via EPIDURAL
  Filled 2016-01-31: qty 125

## 2016-01-31 MED ORDER — SOD CITRATE-CITRIC ACID 500-334 MG/5ML PO SOLN: 30.0000 mL | ORAL | Status: DC | PRN

## 2016-01-31 MED ORDER — EPHEDRINE 5 MG/ML INJ
10.0000 mg | INTRAVENOUS | Status: DC | PRN
  Filled 2016-01-31: qty 4

## 2016-01-31 MED ORDER — FLEET ENEMA 7-19 GM/118ML RE ENEM: 1.0000 | ENEMA | RECTAL | Status: DC | PRN

## 2016-01-31 MED ORDER — LACTATED RINGERS IV SOLN
500.0000 mL | INTRAVENOUS | Status: DC | PRN
  Administered 2016-01-31: 1000 mL via INTRAVENOUS

## 2016-01-31 MED ORDER — ACETAMINOPHEN 325 MG PO TABS: 650.0000 mg | ORAL_TABLET | ORAL | Status: DC | PRN

## 2016-01-31 MED ORDER — LACTATED RINGERS IV SOLN: 500.0000 mL | Freq: Once | INTRAVENOUS | Status: DC

## 2016-01-31 MED ORDER — PHENYLEPHRINE 40 MCG/ML (10ML) SYRINGE FOR IV PUSH (FOR BLOOD PRESSURE SUPPORT)
80.0000 ug | PREFILLED_SYRINGE | INTRAVENOUS | Status: DC | PRN
  Filled 2016-01-31: qty 5
  Filled 2016-01-31: qty 10

## 2016-01-31 MED ORDER — OXYTOCIN 40 UNITS IN LACTATED RINGERS INFUSION - SIMPLE MED
2.5000 [IU]/h | INTRAVENOUS | Status: DC
  Filled 2016-01-31: qty 1000

## 2016-01-31 MED ORDER — DIPHENHYDRAMINE HCL 50 MG/ML IJ SOLN: 12.5000 mg | INTRAMUSCULAR | Status: DC | PRN

## 2016-01-31 MED ORDER — LACTATED RINGERS IV SOLN: INTRAVENOUS | Status: DC

## 2016-01-31 MED ORDER — LIDOCAINE HCL (PF) 1 % IJ SOLN
INTRAMUSCULAR | Status: DC | PRN
  Administered 2016-01-31: 5 mL via EPIDURAL
  Administered 2016-01-31: 2 mL via EPIDURAL
  Administered 2016-01-31: 3 mL via EPIDURAL

## 2016-01-31 MED ORDER — ONDANSETRON HCL 4 MG/2ML IJ SOLN: 4.0000 mg | Freq: Four times a day (QID) | INTRAMUSCULAR | Status: DC | PRN

## 2016-01-31 MED ORDER — OXYTOCIN BOLUS FROM INFUSION: 500.0000 mL | Freq: Once | INTRAVENOUS | Status: DC

## 2016-01-31 MED ORDER — PHENYLEPHRINE 40 MCG/ML (10ML) SYRINGE FOR IV PUSH (FOR BLOOD PRESSURE SUPPORT)
80.0000 ug | PREFILLED_SYRINGE | INTRAVENOUS | Status: DC | PRN
  Filled 2016-01-31: qty 5

## 2016-01-31 MED ORDER — LIDOCAINE HCL (PF) 1 % IJ SOLN
30.0000 mL | INTRAMUSCULAR | Status: DC | PRN
  Filled 2016-01-31: qty 30

## 2016-01-31 NOTE — MAU Provider Note (Signed)
History   [id number]   Chief Complaint  Patient presents with  . Rupture of Membranes    HPI Susan Craig is a 25 y.o. female  G1P0000 here with report of watery vaginal discharge that began at approximately 8 pm tonight.  Leaking of fluid has continued.  Pt reports irregular contractions and denies vaginal bleeding. Positive fetal movement.   All other systems negative.    Patient's last menstrual period was 05/15/2015.  OB History  Gravida Para Term Preterm AB Living  1 0 0 0 0    SAB TAB Ectopic Multiple Live Births  0 0 0        # Outcome Date GA Lbr Len/2nd Weight Sex Delivery Anes PTL Lv  1 Current               Past Medical History:  Diagnosis Date  . Anxiety   . Chicken pox     Family History  Problem Relation Age of Onset  . Arthritis Paternal Grandmother   . Depression Paternal Grandmother   . Cancer Paternal Grandmother     pancreatic  . Lung cancer Maternal Grandmother     Smoker  . High blood pressure Paternal Grandfather   . Heart disease Paternal Grandfather     a fib-- cabg  . Depression Father   . Anxiety disorder Mother   . Anxiety disorder Sister   . COPD Maternal Grandfather     Social History   Social History  . Marital status: Married    Spouse name: N/A  . Number of children: N/A  . Years of education: N/A   Occupational History  . nurse [employer]   Social History Main Topics  . Smoking status: Never Smoker  . Smokeless tobacco: Never Used  . Alcohol use No     Comment: Occasionally  . Drug use: No  . Sexual activity: Yes    Partners: Male    Birth control/ protection: Pill   Other Topics Concern  . None   Social History Narrative   Lives with husband in a 2 story home.  Currently pregnant.  No other children.  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          No Known Allergies  No current facility-administered medications on file prior to encounter.    Current Outpatient  Prescriptions on File Prior to Encounter  Medication Sig Dispense Refill  . acetaZOLAMIDE (DIAMOX) 250 MG tablet Take 3 tablets (750 mg) in the A.M., and 2 tablets (500 mg) in the P.M. 180 tablet 2  . Prenatal Vit-Fe Fumarate-FA (PRENATAL MULTIVITAMIN) TABS tablet Take 1 tablet by mouth daily at 12 noon.    . sertraline (ZOLOFT) 25 MG tablet   5     Review of Systems  Gastrointestinal: Positive for abdominal pain.  Genitourinary: Positive for vaginal discharge. Negative for vaginal bleeding.     Physical Exam   Vitals:   01/31/16 2116  BP: 125/79  Pulse: 79  Resp: 16  Temp: 97.8 F (36.6 C)  TempSrc: Oral    Physical Exam  Nursing note and vitals reviewed. Constitutional: She is oriented to person, place, and time. She appears well-developed and well-nourished. No distress.  HENT:  Head: Normocephalic and atraumatic.  Eyes: Conjunctivae are normal. Right eye exhibits no discharge. Left eye exhibits no discharge. No scleral icterus.  Neck: Normal range of motion.  Cardiovascular: Normal rate.   Respiratory: Effort normal. No  respiratory distress.  Genitourinary:  Genitourinary Comments: Pooling of clear fluid  Neurological: She is alert and oriented to person, place, and time.  Skin: Skin is warm and dry. She is not diaphoretic.  Psychiatric: She has a normal mood and affect. Her behavior is normal. Judgment and thought content normal.   Fetal Tracing:  Baseline: 120 Variability: moderate Accelerations: 15x15 Decelerations: none  Toco: irregular ctx   MAU Course  Procedures Results for orders placed or performed during the hospital encounter of 01/31/16 (from the past 24 hour(s))  Fern Test     Status: None   Collection Time: 01/31/16  9:35 PM  Result Value Ref Range   POCT Fern Test Positive = ruptured amniotic membanes     MDM Positive pooling & positive Fern  Assessment and Plan  A: 1. Full-term premature rupture of membranes, unspecified duration to  onset of labor    P: RN will call MD for admission orders    Susan Horn, NP 01/31/2016 9:24 PM

## 2016-01-31 NOTE — Anesthesia Procedure Notes (Signed)
Epidural Patient location during procedure: OB  Staffing Anesthesiologist: TURK, STEPHEN EDWARD Performed: anesthesiologist   Preanesthetic Checklist Completed: patient identified, pre-op evaluation, timeout performed, IV checked, risks and benefits discussed and monitors and equipment checked  Epidural Patient position: sitting Prep: DuraPrep Patient monitoring: blood pressure and continuous pulse ox Approach: midline Location: L3-L4 Injection technique: LOR air  Needle:  Needle type: Tuohy  Needle gauge: 17 G Needle length: 9 cm Needle insertion depth: 5 cm Catheter size: 19 Gauge Catheter at skin depth: 10 cm Test dose: negative and Other (1% Lidocaine)  Additional Notes Patient identified.  Risk benefits discussed including failed block, incomplete pain control, headache, nerve damage, paralysis, blood pressure changes, nausea, vomiting, reactions to medication both toxic or allergic, and postpartum back pain.  Patient expressed understanding and wished to proceed.  All questions were answered.  Sterile technique used throughout procedure and epidural site dressed with sterile barrier dressing. No paresthesia or other complications noted. The patient did not experience any signs of intravascular injection such as tinnitus or metallic taste in mouth nor signs of intrathecal spread such as rapid motor block. Please see nursing notes for vital signs. Reason for block:procedure for pain     

## 2016-01-31 NOTE — Anesthesia Preprocedure Evaluation (Signed)
Anesthesia Evaluation  Patient identified by MRN, date of birth, ID band Patient awake    Reviewed: Allergy & Precautions, NPO status , Patient's Chart, lab work & pertinent test results  Airway Mallampati: II  TM Distance: >3 FB Neck ROM: Full    Dental  (+) Teeth Intact, Dental Advisory Given   Pulmonary neg pulmonary ROS,    Pulmonary exam normal breath sounds clear to auscultation       Cardiovascular Exercise Tolerance: Good negative cardio ROS Normal cardiovascular exam Rhythm:Regular Rate:Normal     Neuro/Psych  Headaches,    GI/Hepatic negative GI ROS, Neg liver ROS,   Endo/Other  negative endocrine ROSObesity   Renal/GU negative Renal ROS     Musculoskeletal negative musculoskeletal ROS (+)   Abdominal   Peds  Hematology  (+) Blood dyscrasia, anemia , Plt 291k    Anesthesia Other Findings Day of surgery medications reviewed with the patient.  Reproductive/Obstetrics (+) Pregnancy                             Anesthesia Physical Anesthesia Plan  ASA: II  Anesthesia Plan: Epidural   Post-op Pain Management:    Induction:   Airway Management Planned:   Additional Equipment:   Intra-op Plan:   Post-operative Plan:   Informed Consent: I have reviewed the patients History and Physical, chart, labs and discussed the procedure including the risks, benefits and alternatives for the proposed anesthesia with the patient or authorized representative who has indicated his/her understanding and acceptance.   Dental advisory given  Plan Discussed with:   Anesthesia Plan Comments: (Patient identified. Risks/Benefits/Options discussed with patient including but not limited to bleeding, infection, nerve damage, paralysis, failed block, incomplete pain control, headache, blood pressure changes, nausea, vomiting, reactions to medication both or allergic, itching and postpartum back  pain. Confirmed with bedside nurse the patient's most recent platelet count. Confirmed with patient that they are not currently taking any anticoagulation, have any bleeding history or any family history of bleeding disorders. Patient expressed understanding and wished to proceed. All questions were answered. )        Anesthesia Quick Evaluation

## 2016-01-31 NOTE — MAU Note (Signed)
Patient presents with PROM at 2000 tonight. Patient denies any bleeding. Fetus active.

## 2016-01-31 NOTE — H&P (Signed)
Susan RicksHayley A Craig is a 25 y.o. female presenting for SROM at early term. OB History    Gravida Para Term Preterm AB Living   1 0 0 0 0     SAB TAB Ectopic Multiple Live Births   0 0 0         Past Medical History:  Diagnosis Date  . Anxiety   . Chicken pox    Past Surgical History:  Procedure Laterality Date  . NOSE SURGERY     septoplasty x2 and revision x1--- [facility]  . TONSILLECTOMY  2009   Family History: family history includes Anxiety disorder in her mother and sister; Arthritis in her paternal grandmother; COPD in her maternal grandfather; Cancer in her paternal grandmother; Depression in her father and paternal grandmother; Heart disease in her paternal grandfather; High blood pressure in her paternal grandfather; Lung cancer in her maternal grandmother. Social History:  reports that she has never smoked. She has never used smokeless tobacco. She reports that she does not drink alcohol or use drugs.     Maternal Diabetes: No Genetic Screening: Normal Maternal Ultrasounds/Referrals: Normal Fetal Ultrasounds or other Referrals:  None Maternal Substance Abuse:  No Significant Maternal Medications:  Meds include: Other: diamox for ICH Significant Maternal Lab Results:  None Other Comments:  None  Review of Systems  Constitutional: Negative.   All other systems reviewed and are negative.  Maternal Medical History:  Reason for admission: Rupture of membranes.   Contractions: Onset was less than 1 hour ago.   Frequency: rare.   Perceived severity is mild.    Fetal activity: Perceived fetal activity is normal.   Last perceived fetal movement was within the past hour.    Prenatal complications: no prenatal complications Prenatal Complications - Diabetes: none.    Dilation: 3 Effacement (%): 80 Station: -2 Exam by:: Susan EavesAshley Garvey RN  Blood pressure 125/79, pulse 79, temperature 98.6 F (37 C), temperature source Oral, resp. rate 18, height 5\' 5"  (1.651 m), weight  95.7 kg (211 lb), last menstrual period 05/15/2015. Maternal Exam:  Uterine Assessment: Contraction strength is mild.  Contraction frequency is rare.   Abdomen: Patient reports no abdominal tenderness. Fetal presentation: vertex  Introitus: Normal vulva. Normal vagina.  Ferning test: positive.  Nitrazine test: positive. Amniotic fluid character: clear.  Pelvis: adequate for delivery.   Cervix: Cervix evaluated by digital exam.     Physical Exam  Nursing note and vitals reviewed. Constitutional: She is oriented to person, place, and time. She appears well-developed and well-nourished.  HENT:  Head: Normocephalic and atraumatic.  Neck: Neck supple.  Cardiovascular: Normal rate and regular rhythm.   Respiratory: Effort normal and breath sounds normal.  GI: Soft. Bowel sounds are normal.  Genitourinary: Vagina normal and uterus normal.  Musculoskeletal: Normal range of motion.  Neurological: She is alert and oriented to person, place, and time. She has normal reflexes.  Skin: Skin is warm and dry.  Psychiatric: She has a normal mood and affect.    Prenatal labs: ABO, Rh:   Antibody:   Rubella:   RPR:    HBsAg:    HIV:    GBS:     Assessment/Plan: Early term SROM ICH stable on Diamox. Nl opth exam Routine orders. Pitocin in am   Susan Craig J 01/31/2016, 9:59 PM

## 2016-01-31 NOTE — MAU Note (Signed)
Notified provider that patient is fern positive SROM at 2000. GBS -. 3/80/-2 on cervical exam. Provider said to put in admit orders

## 2016-02-01 ENCOUNTER — Encounter (HOSPITAL_COMMUNITY): Payer: Self-pay | Admitting: *Deleted

## 2016-02-01 DIAGNOSIS — D62 Acute posthemorrhagic anemia: Secondary | ICD-10-CM | POA: Diagnosis not present

## 2016-02-01 LAB — CBC
HEMATOCRIT: 27 % — AB (ref 36.0–46.0)
HEMOGLOBIN: 9.3 g/dL — AB (ref 12.0–15.0)
MCH: 29.4 pg (ref 26.0–34.0)
MCHC: 34.4 g/dL (ref 30.0–36.0)
MCV: 85.4 fL (ref 78.0–100.0)
Platelets: 234 10*3/uL (ref 150–400)
RBC: 3.16 MIL/uL — ABNORMAL LOW (ref 3.87–5.11)
RDW: 13.1 % (ref 11.5–15.5)
WBC: 23.9 10*3/uL — ABNORMAL HIGH (ref 4.0–10.5)

## 2016-02-01 LAB — TYPE AND SCREEN
ABO/RH(D): A POS
ANTIBODY SCREEN: NEGATIVE

## 2016-02-01 LAB — ABO/RH: ABO/RH(D): A POS

## 2016-02-01 LAB — RPR: RPR: NONREACTIVE

## 2016-02-01 MED ORDER — ONDANSETRON HCL 4 MG PO TABS: 4.0000 mg | ORAL_TABLET | ORAL | Status: DC | PRN

## 2016-02-01 MED ORDER — METHYLERGONOVINE MALEATE 0.2 MG/ML IJ SOLN: 0.2000 mg | INTRAMUSCULAR | Status: DC | PRN

## 2016-02-01 MED ORDER — WITCH HAZEL-GLYCERIN EX PADS: 1.0000 "application " | MEDICATED_PAD | CUTANEOUS | Status: DC | PRN

## 2016-02-01 MED ORDER — ACETAZOLAMIDE 250 MG PO TABS
250.0000 mg | ORAL_TABLET | Freq: Once | ORAL | Status: AC
  Administered 2016-02-01: 250 mg via ORAL
  Filled 2016-02-01: qty 1

## 2016-02-01 MED ORDER — DIBUCAINE 1 % RE OINT: 1.0000 "application " | TOPICAL_OINTMENT | RECTAL | Status: DC | PRN

## 2016-02-01 MED ORDER — POLYSACCHARIDE IRON COMPLEX 150 MG PO CAPS
150.0000 mg | ORAL_CAPSULE | Freq: Every day | ORAL | Status: DC
  Administered 2016-02-01 – 2016-02-03 (×3): 150 mg via ORAL
  Filled 2016-02-01 (×3): qty 1

## 2016-02-01 MED ORDER — ZOLPIDEM TARTRATE 5 MG PO TABS: 5.0000 mg | ORAL_TABLET | Freq: Every evening | ORAL | Status: DC | PRN

## 2016-02-01 MED ORDER — SIMETHICONE 80 MG PO CHEW: 80.0000 mg | CHEWABLE_TABLET | ORAL | Status: DC | PRN

## 2016-02-01 MED ORDER — SENNOSIDES-DOCUSATE SODIUM 8.6-50 MG PO TABS
2.0000 | ORAL_TABLET | ORAL | Status: DC
  Administered 2016-02-02 (×2): 2 via ORAL
  Filled 2016-02-01 (×2): qty 2

## 2016-02-01 MED ORDER — ACETAZOLAMIDE 250 MG PO TABS
500.0000 mg | ORAL_TABLET | Freq: Every day | ORAL | Status: DC
  Administered 2016-02-01 – 2016-02-02 (×2): 500 mg via ORAL
  Filled 2016-02-01 (×4): qty 2

## 2016-02-01 MED ORDER — ACETAZOLAMIDE 250 MG PO TABS
750.0000 mg | ORAL_TABLET | Freq: Every day | ORAL | Status: DC
  Administered 2016-02-02 – 2016-02-03 (×2): 750 mg via ORAL
  Filled 2016-02-01 (×3): qty 3

## 2016-02-01 MED ORDER — METHYLERGONOVINE MALEATE 0.2 MG PO TABS: 0.2000 mg | ORAL_TABLET | ORAL | Status: DC | PRN

## 2016-02-01 MED ORDER — PRENATAL MULTIVITAMIN CH
1.0000 | ORAL_TABLET | Freq: Every day | ORAL | Status: DC
  Administered 2016-02-01 – 2016-02-03 (×3): 1 via ORAL
  Filled 2016-02-01 (×3): qty 1

## 2016-02-01 MED ORDER — ACETAZOLAMIDE 250 MG PO TABS
500.0000 mg | ORAL_TABLET | Freq: Once | ORAL | Status: AC
Start: 2016-02-01 — End: 2016-02-01
  Administered 2016-02-01: 500 mg via ORAL
  Filled 2016-02-01: qty 2

## 2016-02-01 MED ORDER — TETANUS-DIPHTH-ACELL PERTUSSIS 5-2.5-18.5 LF-MCG/0.5 IM SUSP: 0.5000 mL | Freq: Once | INTRAMUSCULAR | Status: DC

## 2016-02-01 MED ORDER — ACETAMINOPHEN 325 MG PO TABS: 650.0000 mg | ORAL_TABLET | ORAL | Status: DC | PRN

## 2016-02-01 MED ORDER — DIPHENHYDRAMINE HCL 25 MG PO CAPS: 25.0000 mg | ORAL_CAPSULE | Freq: Four times a day (QID) | ORAL | Status: DC | PRN

## 2016-02-01 MED ORDER — SERTRALINE HCL 25 MG PO TABS
25.0000 mg | ORAL_TABLET | Freq: Every day | ORAL | Status: DC
  Administered 2016-02-01: 25 mg via ORAL
  Filled 2016-02-01 (×2): qty 1

## 2016-02-01 MED ORDER — COCONUT OIL OIL: 1.0000 "application " | TOPICAL_OIL | Status: DC | PRN

## 2016-02-01 MED ORDER — ONDANSETRON HCL 4 MG/2ML IJ SOLN: 4.0000 mg | INTRAMUSCULAR | Status: DC | PRN

## 2016-02-01 MED ORDER — OXYCODONE-ACETAMINOPHEN 5-325 MG PO TABS: 2.0000 | ORAL_TABLET | ORAL | Status: DC | PRN

## 2016-02-01 MED ORDER — BENZOCAINE-MENTHOL 20-0.5 % EX AERO
1.0000 "application " | INHALATION_SPRAY | CUTANEOUS | Status: DC | PRN
  Filled 2016-02-01: qty 56

## 2016-02-01 MED ORDER — OXYCODONE-ACETAMINOPHEN 5-325 MG PO TABS: 1.0000 | ORAL_TABLET | ORAL | Status: DC | PRN

## 2016-02-01 MED ORDER — IBUPROFEN 600 MG PO TABS
600.0000 mg | ORAL_TABLET | Freq: Four times a day (QID) | ORAL | Status: DC
  Administered 2016-02-01 – 2016-02-03 (×10): 600 mg via ORAL
  Filled 2016-02-01 (×8): qty 1

## 2016-02-01 NOTE — Progress Notes (Signed)
INTERVAL NOTE:  S:  Sitting in bed, min cramping, (+) voids, small bleed, denies HA/NV/dizziness  O:  VSS, AAO x 3, NAD  FF bellow U  Scant lochia  A / P:   PPD #0  Stable post partum  Routine PP orders  PAUL,DANIELA, CNM, MSN  02/01/2016 9:26 AM

## 2016-02-01 NOTE — Lactation Note (Signed)
This note was copied from a baby's chart. Lactation Consultation Note Follow up visit at 21 hours of age.  Baby has had circ today and has been sleepy.  Mom reports using NS to latch baby.  Mom has large full breasts with flat nipples.  Right nipple noted to have a skin tag that she reports is new during pregnancy.  LC assisted with latching without NS, baby is rooting back and not holding latch well.  NS applied in laid back position and baby finally latched with much effort.  Baby sucked on and off for about 20 minutes with breast stimulation.  Baby asleep on moms chest.  Lc discussed using a DEBP to protect milk supply with NS use and mom agreeable.  LC instructed on use of DEBP, frequency and cleaning.  Baby began showing more feeding cues and assisted mom with positioning in football hold on left breast.  Baby latched well with strong vigorous sucking for an additional 10 minutes with swallows audible and heard by parents.  Baby then asleep and satisfied by feeding.  Mom pumping for 15 minutes post feedings and will spoon feed to baby as needed. Mom to call RN for assist as needed.       Patient Name: Susan Craig Reason for consult: Follow-up assessment;Difficult latch   Maternal Data    Feeding Feeding Type: Breast Fed Length of feed: 30 min  LATCH Score/Interventions Latch: Repeated attempts needed to sustain latch, nipple held in mouth throughout feeding, stimulation needed to elicit sucking reflex. Intervention(s): Adjust position;Assist with latch;Breast massage;Breast compression  Audible Swallowing: A few with stimulation Intervention(s): Skin to skin;Hand expression  Type of Nipple: Flat Intervention(s): Hand pump;Shells  Comfort (Breast/Nipple): Soft / non-tender     Hold (Positioning): Assistance needed to correctly position infant at breast and maintain latch. Intervention(s): Breastfeeding basics reviewed;Support Pillows;Position  options;Skin to skin  LATCH Score: 6  Lactation Tools Discussed/Used Tools: Nipple Shields Nipple shield size: 20 Pump Review: Setup, frequency, and cleaning Initiated by:: JS Date initiated:: 02/01/16   Consult Status Consult Status: Follow-up Date: 02/02/16 Follow-up type: In-patient    Susan Craig, Susan Craig Craig, 10:02 PM

## 2016-02-01 NOTE — Anesthesia Postprocedure Evaluation (Signed)
Anesthesia Post Note  Patient: Celinda A Commons  Procedure(s) Performed: * No procedures listed *  Patient location during evaluation: Mother Baby Anesthesia Type: Epidural Level of consciousness: awake and alert Pain management: pain level controlled Vital Signs Assessment: post-procedure vital signs reviewed and stable Respiratory status: spontaneous breathing, nonlabored ventilation and respiratory function stable Cardiovascular status: stable Postop Assessment: no headache, no backache and epidural receding Anesthetic complications: no     Last Vitals:  Vitals:   02/01/16 0300 02/01/16 0445  BP: 120/70 (!) 89/63  Pulse: 87 94  Resp: 18 18  Temp: 36.8 C 36.7 C    Last Pain:  Vitals:   02/01/16 0445  TempSrc:   PainSc: 0-No pain   Pain Goal: Patients Stated Pain Goal: 0 (01/31/16 2116)               Junious SilkGILBERT,Lillianna Sabel

## 2016-02-01 NOTE — Lactation Note (Signed)
This note was copied from a baby's chart. Lactation Consultation Note; Initial visit with mom. Baby now 5612 hours old. She reports she is using NS because her nipples are flat. Reports baby is latching well with NS and she sees Colostrum on NS when he comes off the breast. Baby in nursery for circ at present. IN shells and hand pump given with instructions for use. Mom put shells on now. BF brochure given. Reviewed our phone number, OP appointments and BFSG as resources for support after DC. No questions at present. To call for assist prn  Patient Name: Susan Johney FrameHayley Higdon UJWJX'BToday's Date: 02/01/2016 Reason for consult: Initial assessment   Maternal Data Formula Feeding for Exclusion: No Has patient been taught Hand Expression?: Yes (mom reports she knows how and is able to see some Colostrum) Does the patient have breastfeeding experience prior to this delivery?: No  Feeding Feeding Type: Breast Fed Length of feed: 11 min  LATCH Score/Interventions Latch: Grasps breast easily, tongue down, lips flanged, rhythmical sucking. Intervention(s): Adjust position;Assist with latch;Breast massage;Breast compression  Audible Swallowing: A few with stimulation Intervention(s): Skin to skin;Hand expression Intervention(s): Skin to skin;Hand expression  Type of Nipple: Flat  Comfort (Breast/Nipple): Soft / non-tender     Hold (Positioning): Assistance needed to correctly position infant at breast and maintain latch. Intervention(s): Position options;Support Pillows;Breastfeeding basics reviewed;Skin to skin  LATCH Score: 7  Lactation Tools Discussed/Used Tools: Shells;Nipple Dorris CarnesShields;Pump Nipple shield size: 20 Shell Type: Inverted Breast pump type: Manual   Consult Status Consult Status: Follow-up Date: 02/01/16 Follow-up type: In-patient    Pamelia HoitWeeks, Kenlea Woodell D 02/01/2016, 1:18 PM

## 2016-02-02 ENCOUNTER — Encounter (HOSPITAL_COMMUNITY): Payer: Self-pay

## 2016-02-02 MED ORDER — SERTRALINE HCL 50 MG PO TABS
50.0000 mg | ORAL_TABLET | Freq: Every day | ORAL | Status: DC
  Administered 2016-02-02: 50 mg via ORAL
  Filled 2016-02-02: qty 1

## 2016-02-02 NOTE — Progress Notes (Signed)
PPD # 1 SVD Information for the patient's newborn:  Rozanna BoxSharpe, Boy Yulia [[id number]][[id number]]  female    breast feeding  / Circumcision completed   S:  Reports feeling tired but well.             Tolerating po/ No nausea or vomiting             Bleeding is light             Pain controlled with po meds             Up ad lib / ambulatory / voiding without difficulties        O:  A & O x 3, in no apparent distress              VS:  Vitals:   02/01/16 0915 02/01/16 1730 02/02/16 0500  BP: 132/70 120/69 104/62  Pulse: 88 83 73  Resp: 20 18 18   Temp: 97.5 F (36.4 C) 98.1 F (36.7 C) 97.7 F (36.5 C)  TempSrc: Oral Oral Oral  SpO2:  99%   Weight:     Height:       LABS:  Recent Labs  01/31/16 2145 02/01/16 0623  WBC 16.9* 23.9*  HGB 11.6* 9.3*  HCT 34.2* 27.0*  PLT 291 234    Blood type: --/--/A POS, A POS (09/21 2145)  Rubella: Immune (03/09 0000)   I&O: No intake/output data recorded.          No intake/output data recorded.  Lungs: Clear and unlabored  Heart: regular rate and rhythm / no murmurs  Abdomen: soft, non-tender, non-distended             Fundus: firm, non-tender, U-2  Perineum: no edema  Lochia: small  Extremities: no edema, no calf pain or tenderness    A/P: PPD # 1 25 y.o., G1P1  Principal Problem:   Postpartum care following vaginal delivery (9/22) Active Problems:   Acute blood loss anemia    Doing well - stable status  Routine post partum orders  Anticipate discharge tomorrow    Neta Mendsaniela C Paul, MSN, CNM 02/02/2016, 1:30PM

## 2016-02-02 NOTE — Progress Notes (Signed)
Pt states she takes 50mg  zoloft at bedtime. Current order for 25mg . Arlan Organaniela Paul CNM called and order received to change dose.

## 2016-02-02 NOTE — Clinical Social Work Maternal (Signed)
  CLINICAL SOCIAL WORK MATERNAL/CHILD NOTE  Patient Details  Name: Susan Craig MRN: [id number] Date of Birth: Sep 19, 1990  Date:  02/02/2016  Clinical Social Worker Initiating Note:  Ferdinand Lango Chara Marquard, MSW, LCSW-A Date/ Time Initiated:  02/02/16/1051     Child's Name:  Susan Craig    Legal Guardian:  Other (Comment) (Both mother and father )   Need for Interpreter:  None   Date of Referral:  02/01/16     Reason for Referral:  Other (Comment) (MOB hx of anxiety/ Depression )   Referral Source:  Physician   Address:  [address] [place] [address]  Phone number:  [phone]   Household Members:  Self, Spouse   Natural Supports (not living in the home):  Immediate Family, Friends   Medical illustrator Supports:     Employment:     Type of Work:     Education:      Pensions consultant:  Multimedia programmer   Other Resources:      Cultural/Religious Considerations Which May Impact Care:  None reported at this time.   Strengths:  Ability to meet basic needs , Home prepared for child , Pediatrician chosen    Risk Factors/Current Problems:  Other (Comment) (MOB hx of Anixety )   Cognitive State:  Alert , Goal Oriented , Insightful    Mood/Affect:  Calm , Interested , Comfortable    CSW Assessment: CSW met with MOB and FOB at bedside. This Probation officer explained role and reasoning for visit being due to MOB having a recent hx of anxiety. At this time MOB confirms she does suffer from anxiety and is currently taking Zoloft. She notes in the past taking Paxel. MOB and FOB stated no concerns with discharging home and feel they have everything they need. This Probation officer discussed SIDS and PPD. Both MOB and FOB verbalize understanding.  At this time, no other needs addressed or requested. Case closed to this CSW.   CSW Plan/Description:  No Further Intervention Required/No Barriers to Discharge    Oda Cogan, MSW, [facility]  Office: [phone]

## 2016-02-03 MED ORDER — BENZOCAINE-MENTHOL 20-0.5 % EX AERO: 1.0000 "application " | INHALATION_SPRAY | CUTANEOUS | Status: DC | PRN

## 2016-02-03 MED ORDER — POLYSACCHARIDE IRON COMPLEX 150 MG PO CAPS: 150.0000 mg | ORAL_CAPSULE | Freq: Every day | ORAL | Status: DC

## 2016-02-03 MED ORDER — IBUPROFEN 600 MG PO TABS: 600.0000 mg | ORAL_TABLET | Freq: Four times a day (QID) | ORAL | 0 refills | Status: DC

## 2016-02-03 MED ORDER — SERTRALINE HCL 50 MG PO TABS: 50.0000 mg | ORAL_TABLET | Freq: Every day | ORAL | 0 refills | Status: DC

## 2016-02-03 NOTE — Discharge Instructions (Signed)
Breast Pumping Tips °If you are breastfeeding, there may be times when you cannot feed your baby directly. Returning to work or going on a trip are common examples. Pumping allows you to store breast milk and feed it to your baby later.  °You may not get much milk when you first start to pump. Your breasts should start to make more after a few days. If you pump at the times you usually feed your baby, you may be able to keep making enough milk to feed your baby without also using formula. The more often you pump, the more milk you will produce.  °WHEN SHOULD I PUMP?  °· You can begin to pump soon after delivery. However, some experts recommend waiting about 4 weeks before giving your infant a bottle to make sure breastfeeding is going well.  °· If you plan to return to work, begin pumping a few weeks before. This will help you develop techniques that work best for you. It also lets you build up a supply of breast milk.   °· When you are with your infant, feed on demand and pump after each feeding.   °· When you are away from your infant for several hours, pump for about 15 minutes every 2-3 hours. Pump both breasts at the same time if you can.   °· If your infant has a formula feeding, make sure to pump around the same time.     °· If you drink any alcohol, wait 2 hours before pumping.   °HOW DO I PREPARE TO PUMP? °Your let-down reflex is the natural reaction to stimulation that makes your breast milk flow. It is easier to stimulate this reflex when you are relaxed. Find relaxation techniques that work for you. If you have difficulty with your let-down reflex, try these methods:  °· Smell one of your infant's blankets or an item of clothing.   °· Look at a picture or video of your infant.   °· Sit in a quiet, private space.   °· Massage the breast you plan to pump.   °· Place soothing warmth on the breast.   °· Play relaxing music.   °WHAT ARE SOME GENERAL BREAST PUMPING TIPS? °· Wash your hands before you pump. You  do not need to wash your nipples or breasts. °· There are three ways to pump. °¨ You can use your hand to massage and compress your breast. °¨ You can use a handheld manual pump. °¨ You can use an electric pump.   °· Make sure the suction cup (flange) on the breast pump is the right size. Place the flange directly over the nipple. If it is the wrong size or placed the wrong way, it may be painful and cause nipple damage.   °· If pumping is uncomfortable, apply a small amount of purified or modified lanolin to your nipple and areola. °· If you are using an electric pump, adjust the speed and suction power to be more comfortable. °· If pumping is painful or if you are not getting very much milk, you may need a different type of pump. A lactation consultant can help you determine what type of pump to use.   °· Keep a full water bottle near you at all times. Drinking lots of fluid helps you make more milk.  °· You can store your milk to use later. Pumped breast milk can be stored in a sealable, sterile container or plastic bag. Label all stored breast milk with the date you pumped it. °¨ Milk can stay out at room temperature for up to 8 hours. °¨   You can store your milk in the refrigerator for up to 8 days. °¨ You can store your milk in the freezer for 3 months. Thaw frozen milk using warm water. Do not put it in the microwave. °· Do not smoke. Smoking can lower your milk supply and harm your infant. If you need help quitting, ask your health care provider to recommend a program.   °WHEN SHOULD I CALL MY HEALTH CARE PROVIDER OR A LACTATION CONSULTANT? °· You are having trouble pumping. °· You are concerned that you are not making enough milk. °· You have nipple pain, soreness, or redness. °· You want to use birth control. Birth control pills may lower your milk supply. Talk to your health care provider about your options. °  °This information is not intended to replace advice given to you by your health care provider.  Make sure you discuss any questions you have with your health care provider. °  °Document Released: 10/16/2009 Document Revised: 05/03/2013 Document Reviewed: 02/18/2013 °Elsevier Interactive Patient Education ©2016 [employer] ° °

## 2016-02-03 NOTE — Progress Notes (Signed)
Pt decline flu vaccine at this time. VIS given. Pt stated she would get her flu vaccine at work. Will continue to monitor pt.

## 2016-02-03 NOTE — Lactation Note (Signed)
This note was copied from a baby's chart. Lactation Consultation Note Mom has flat nipples. Wearing shells has everted nipples. Mom has edema in breast from filling. Shells has enlarged nipples needing #20 NS. Gave #24 as well to take home if needed. Educated application of NS application, and care. Mom has DEBP at home. Mom has knots in breast and firming. Educated on engorgement, filling, supply and demand, and clogged ducts.  Put baby to breast to feed first, ice applied, breast massage. Relieved firmness a lot. Still needs to port pump to soften more. Rt. Breast full. Reviewed cleaning pump. Notice not rinsing, and removing all parts for cleaning. Mom will call for follow appt. W/LC for wearing NS and BF assessment on Monday when has idea of schedule. Stress importance of f/u wearing NS.  Baby jaundice in appearance. Mom stated level w/ing limits. RN notified as well.  Patient Name: Boy Johney FrameHayley Craig XBMWU'XToday's Date: 02/03/2016 Reason for consult: Follow-up assessment   Maternal Data    Feeding Feeding Type: Breast Fed Length of feed: 20 min  LATCH Score/Interventions Latch: Grasps breast easily, tongue down, lips flanged, rhythmical sucking. Intervention(s): Adjust position;Assist with latch;Breast massage;Breast compression  Audible Swallowing: Spontaneous and intermittent Intervention(s): Skin to skin;Hand expression Intervention(s): Alternate breast massage  Type of Nipple: Everted at rest and after stimulation (w/shells) Intervention(s): Shells;Double electric pump;Reverse pressure  Comfort (Breast/Nipple): Filling, red/small blisters or bruises, mild/mod discomfort  Problem noted: Filling Interventions (Filling): Massage;Reverse pressure;Firm support;Frequent nursing;Double electric pump  Hold (Positioning): Assistance needed to correctly position infant at breast and maintain latch. Intervention(s): Breastfeeding basics reviewed;Support Pillows;Position options;Skin to  skin  LATCH Score: 8  Lactation Tools Discussed/Used Tools: Shells;Pump;Nipple Shields Nipple shield size: 20 Shell Type: Inverted Breast pump type: Double-Electric Breast Pump Pump Review: Setup, frequency, and cleaning;Milk Storage Initiated by:: Peri JeffersonL. Alben Jepsen RN IBCLC Date initiated:: 02/03/16   Consult Status Consult Status: Complete Date: 02/03/16 Follow-up type: Other (comment) (to call monday for floow up appt. d/t wearing NS. )    Marcena Dias G 02/03/2016, 11:34 AM

## 2016-02-03 NOTE — Discharge Summary (Signed)
Obstetric Discharge Summary Reason for Admission: onset of labor and rupture of membranes Prenatal Procedures: ultrasound Intrapartum Procedures: spontaneous vaginal delivery and epidural Postpartum Procedures: flu vaccine Complications-Operative and Postpartum: 2nd degree perineal laceration Hemoglobin  Date Value Ref Range Status  02/01/2016 9.3 (L) 12.0 - 15.0 g/dL Final   HCT  Date Value Ref Range Status  02/01/2016 27.0 (L) 36.0 - 46.0 % Final    Physical Exam:  General: alert, cooperative and no distress Lochia: appropriate Uterine Fundus: firm Incision: healing well DVT Evaluation: No significant calf/ankle edema.  Discharge Diagnoses: Term Pregnancy-delivered and Acute bl;ood loss anemia, anxiety, benign intracranial hypertension  Discharge Information: Date: 02/03/2016 Activity: pelvic rest Diet: routine Medications: PNV, Ibuprofen, Colace and Iron Condition: stable Instructions: refer to practice specific booklet Discharge to: home Follow-up Information    Susan Craig,Susan J, MD. Schedule an appointment as soon as possible for a visit in 6 week(s).   Specialty:  Obstetrics and Gynecology Why:  Postpartum visit Contact information: [place] [place] [address] [phone]           Newborn Data: Live born female Clydene Pughsher Birth Weight: 7 lb (3175 g) APGAR: 9, 9  Home with mother.  Susan Craig, CNM 02/03/2016, 9:49 AM

## 2016-02-03 NOTE — Progress Notes (Signed)
Patient ID: Susan Craig, female   DOB: 03/30/1991, 25 y.o.   MRN: [id number] Post Partum Day #2           Information for the patient's newborn:  Rozanna BoxSharpe, Boy Joyce [[id number]][[id number]]  female  circumcision completed Baby name: Clydene Pughsher Feeding: breast  Subjective: No HA, SOB, CP, F/C. Breast symptoms - milk coming in.  Pain minimal. Normal vaginal bleeding, no clots.      Objective:  VS:  Vitals:   02/01/16 1730 02/02/16 0500 02/02/16 1818 02/03/16 0616  BP: 120/69 104/62 105/60 114/65  Pulse: 83 73 88 70  Resp: 18 18 16 18   Temp: 98.1 F (36.7 C) 97.7 F (36.5 C) 98.5 F (36.9 C) 98.1 F (36.7 C)  TempSrc: Oral Oral  Oral  SpO2: 99%  98%   Weight:      Height:        No intake or output data in the 24 hours ending 02/03/16 0936     Recent Labs  01/31/16 2145 02/01/16 0623  WBC 16.9* 23.9*  HGB 11.6* 9.3*  HCT 34.2* 27.0*  PLT 291 234    Blood type: --/--/A POS, A POS (09/21 2145) Rubella: Immune (03/09 0000)    Physical Exam:  General: alert, cooperative and no distress Uterine Fundus: firm Lochia: appropriate Perineum: repair intact, edema none DVT Evaluation: No significant calf/ankle edema.    Assessment/Plan: PPD # 2 / 25 y.o., G1P1001 S/P:spontaneous vaginal   Principal Problem:   Postpartum care following vaginal delivery (9/22) Active Problems:   Acute blood loss anemia   Started oral Fe and continue 4-6 wks PP Leukocytosis - no evidence of infection   normal postpartum exam  Continue current postpartum care  D/C home   LOS: 3 days   Neta Mendsaniela C Jamerica Snavely, CNM, MSN 02/03/2016, 9:36 AM

## 2016-02-04 MED FILL — IBUPROFEN 600 MG TABLET: 600 | 8 days supply | Qty: 30 | Fill #0

## 2016-02-08 ENCOUNTER — Ambulatory Visit (HOSPITAL_COMMUNITY)
Admission: RE | Admit: 2016-02-08 | Discharge: 2016-02-08 | Disposition: A | Discharge status: Home / Self Care | Payer: 59 | Source: Ambulatory Visit | Attending: Family Medicine | Admitting: Family Medicine

## 2016-02-08 NOTE — Lactation Note (Signed)
Lactation Consult for Manpower IncHayley Craig (mother) and Susan Craig (DOB: [birth year])   Mother's reason for visit: nipple shield at d/c Consult:  Initial Lactation Consultant:  Susan Craig, Susan Craig  ________________________________________________________________________ BW: 7#  D/c weight: 6# 11.9oz Wt on 9-26: 6# 14oz Today's weight: 6# 14.6oz    ________________________________________________________________________  Mother's Name: [name] Type of delivery:   Breastfeeding Experience:  primip Maternal Medical Conditions:  Idiopathic intracranial hypertension Maternal Medications: Diamox 750mg  in am, 500mg  @ hs (L2) Zoloft 50mg  qd (L1); PNV; Fe; Colace  ________________________________________________________________________  Breastfeeding History (Post Discharge)  Frequency of breastfeeding: q2-3 during the day; q3-4 at night (8-12 feedings/day) Duration of feeding: 15-30   Pumping  Type of pump:  Medela pump in style Frequency: pumping after he feeds  Volume: 4-6 oz/session (20-min session)   Infant Intake and Output Assessment  Voids: 10+in 24 hrs.  Color:  Clear yellow Stools: 4-6 in 24 hrs.  Color:  Green and Yellow, seedy  ________________________________________________________________________  Maternal Breast Assessment  Breast:  Full Nipple:  Erect   _______________________________________________________________________ Feeding Assessment/Evaluation  Initial feeding assessment:  Infant's oral assessment:  WNL  Attached assessment:  Deep  Lips flanged:  Yes.     Suck assessment:  Nutritive  Tools:  Nipple shield 24 mm Instructed on use and cleaning of tool:  Yes.    Pre-feed weight: 3134 g   Post-feed weight: 3256 g  Amount transferred: 122 ml R breast, 30 minutes  Total amount pumped post feed: 45mL  Total amount transferred:  122 ml  Susan Craig is 671 week old & is almost back to BW. He has gained a negligible amount of weight over  the last 3 days. However, he transferred 4 oz w/ease during the consultation (Mom has an abundant supply). I anticipate that he will soon begin to show impressive weight gain. Mom to have baby weighed on Monday to ensure that weight gain has begun.  Next peds visit is on Thursday, the 5th.   Mom knows to pump for comfort prn & does so. Mom provided w/size 21 flanges w/good result. A size 20 nipple shield works well on the R side, but Mom prefers to use a size 24 on the L side.   I have no concerns. Susan HewKim Channa Hazelett, RN, IBCLC

## 2016-02-11 MED FILL — acetaZOLAMIDE 250 MG TABS: 250 | 30 days supply | Qty: 180 | Fill #2

## 2016-02-12 ENCOUNTER — Encounter: Payer: Self-pay | Admitting: Neurology

## 2016-02-12 ENCOUNTER — Ambulatory Visit (INDEPENDENT_AMBULATORY_CARE_PROVIDER_SITE_OTHER): Payer: 59 | Admitting: Neurology

## 2016-02-12 VITALS — BP 102/60 | HR 102 | Ht 65.0 in | Wt 184.0 lb

## 2016-02-12 DIAGNOSIS — G932 Benign intracranial hypertension: Secondary | ICD-10-CM | POA: Diagnosis not present

## 2016-02-12 NOTE — Patient Instructions (Signed)
1.  Decrease the acetazolamide to 500mg  twice daily (2 pills twice daily).  Once I review Dr. Wende MottWeaver's note, I will contact you regarding if we can decrease dose further. 2.  Follow up in 3 months.

## 2016-02-12 NOTE — Progress Notes (Signed)
NEUROLOGY FOLLOW UP OFFICE NOTE  Susan Craig [id number]  HISTORY OF PRESENT ILLNESS: Susan Craig is a 25 year old right-handed female with anxiety who follows up for idiopathic intracranial hypertension.  She is accompanied by her mother, who supplements history.  UPDATE: Repeat eye exam with Dr. Alben Spittle in late July showed improving papilledema.  She is taking acetazolamide 750mg  in AM and 500mg  at night. She delivered her baby 2 weeks ago.  No complications.  She had her last eye exam about 3 weeks ago.  Progress note not available to me, but she was told that "the numbers were normal".  She is scheduled for formal visual field testing in December.  BMP from 11/12/15 showed Na 135, K 3.5, BUN 10, Cr 0.64 and GFR 119.69.   HISTORY: In January 2017, she started experiencing daily headaches.  She also reported blurred vision, worse in the right eye.  She saw an ophthalmologist who found her to have bilateral papilledema.     MRI of the brain and orbits with and without contrast from 05/31/15 was personally reviewed and were unremarkable.  She had a MRV of the head on 06/06/15, which was personally reviewed and revealed slightly hypoplastic left transverse sinus but no cerebral venous stenosis or thrombosis.  She underwent a lumbar puncture at that time, which revealed an opening pressure of 31 cm H2O.  CSF cell count, protein and glucose were unremarkable.  She was pregnant at that time and was advised not to initiate acetazolamide.  She continued to have headaches and persistent papilledema.  She underwent a therapeutic lumbar puncture on 08/01/15.  Opening pressure was 32 cm H2O.  Symptoms persisted and she was cleared by her obstetrician to start acetazolamide.  She underwent repeat ophthalmologic evaluation on 09/11/15, which still revealed bilateral papilledema with worsening retinal nerve fiber layer thickness. She started on acetazolamide 500mg  twice daily on 09/18/15.  She followed up with  ophthalmology on 10/11/15, which showed bilateral blindspot enlargement.  Headaches and clinical visual disturbance have since resolved.  PAST MEDICAL HISTORY: Past Medical History:  Diagnosis Date  . Anxiety   . Chicken pox     MEDICATIONS: Current Outpatient Prescriptions on File Prior to Visit  Medication Sig Dispense Refill  . acetaZOLAMIDE (DIAMOX) 250 MG tablet Take 3 tablets (750 mg) in the A.M., and 2 tablets (500 mg) in the P.M. 180 tablet 2  . iron polysaccharides (NIFEREX) 150 MG capsule Take 1 capsule (150 mg total) by mouth daily.    . Prenatal Vit-Fe Fumarate-FA (PRENATAL MULTIVITAMIN) TABS tablet Take 1 tablet by mouth daily at 12 noon.    . sertraline (ZOLOFT) 50 MG tablet Take 1 tablet (50 mg total) by mouth at bedtime. 30 tablet 0   No current facility-administered medications on file prior to visit.     ALLERGIES: No Known Allergies  FAMILY HISTORY: Family History  Problem Relation Age of Onset  . Arthritis Paternal Grandmother   . Depression Paternal Grandmother   . Cancer Paternal Grandmother     pancreatic  . Lung cancer Maternal Grandmother     Smoker  . High blood pressure Paternal Grandfather   . Heart disease Paternal Grandfather     a fib-- cabg  . Depression Father   . Anxiety disorder Mother   . Anxiety disorder Sister   . COPD Maternal Grandfather     SOCIAL HISTORY: Social History   Social History  . Marital status: Married    Spouse name: N/A  .  Number of children: N/A  . Years of education: N/A   Occupational History  . nurse [employer]   Social History Main Topics  . Smoking status: Never Smoker  . Smokeless tobacco: Never Used  . Alcohol use No     Comment: Occasionally  . Drug use: No  . Sexual activity: Yes    Partners: Male    Birth control/ protection: Pill   Other Topics Concern  . Not on file   Social History Narrative   Lives with husband in a 2 story home.  Currently pregnant.  No other children.  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          REVIEW OF SYSTEMS: Constitutional: No fevers, chills, or sweats, no generalized fatigue, change in appetite Eyes: No visual changes, double vision, eye pain Ear, nose and throat: No hearing loss, ear pain, nasal congestion, sore throat Cardiovascular: No chest pain, palpitations Respiratory:  No shortness of breath at rest or with exertion, wheezes GastrointestinaI: No nausea, vomiting, diarrhea, abdominal pain, fecal incontinence Genitourinary:  No dysuria, urinary retention or frequency Musculoskeletal:  No neck pain, back pain Integumentary: No rash, pruritus, skin lesions Neurological: as above Psychiatric: No depression, insomnia, anxiety Endocrine: No palpitations, fatigue, diaphoresis, mood swings, change in appetite, change in weight, increased thirst Hematologic/Lymphatic:  No purpura, petechiae. Allergic/Immunologic: no itchy/runny eyes, nasal congestion, recent allergic reactions, rashes  PHYSICAL EXAM: Vitals:   02/12/16 1256  BP: 102/60  Pulse: (!) 102   General: No acute distress.  Patient appears well-groomed.  normal body habitus. Head:  Normocephalic/atraumatic Eyes:  Fundi examined but not visualized Neck: supple, no paraspinal tenderness, full range of motion Heart:  Regular rate and rhythm Lungs:  Clear to auscultation bilaterally Back: No paraspinal tenderness Neurological Exam: alert and oriented to person, place, and time. Attention span and concentration intact, recent and remote memory intact, fund of knowledge intact.  Speech fluent and not dysarthric, language intact.  CN II-XII intact. Bulk and tone normal, muscle strength 5/5 throughout.  Sensation to light touch  intact.  Deep tendon reflexes 2+ throughout.  Finger to nose testing intact.  Gait normal   IMPRESSION: Idiopathic intracranial hypertension, improved  PLAN: 1.  We will decrease acetazolamide to 500mg  twice daily.  I will  review the ophthalmology note and determine if we can decrease dose further to 500mg  daily. 2. She will follow up in 3 months, after visual field testing.  15 minutes spent face to face with patient, over 50% spent counseling.  Shon MilletAdam Jaffe, DO  CC:  Loreen FreudYvonne Lowne, DO

## 2016-02-21 MED FILL — SERTRALINE HCL 50 MG TABLET: 50 | 30 days supply | Qty: 30 | Fill #1

## 2016-02-27 DIAGNOSIS — Z3483 Encounter for supervision of other normal pregnancy, third trimester: Secondary | ICD-10-CM | POA: Diagnosis not present

## 2016-02-27 DIAGNOSIS — Z3482 Encounter for supervision of other normal pregnancy, second trimester: Secondary | ICD-10-CM | POA: Diagnosis not present

## 2016-03-17 DIAGNOSIS — Z30430 Encounter for insertion of intrauterine contraceptive device: Secondary | ICD-10-CM | POA: Diagnosis not present

## 2016-03-26 MED FILL — SERTRALINE HCL 50 MG TABLET: 50 | 30 days supply | Qty: 30 | Fill #2

## 2016-04-02 MED FILL — acetaZOLAMIDE 250 MG TABS: 250 | 30 days supply | Qty: 150 | Fill #0

## 2016-04-15 DIAGNOSIS — N938 Other specified abnormal uterine and vaginal bleeding: Secondary | ICD-10-CM | POA: Diagnosis not present

## 2016-04-21 ENCOUNTER — Encounter: Payer: Self-pay | Admitting: Family Medicine

## 2016-04-22 ENCOUNTER — Other Ambulatory Visit: Payer: Self-pay

## 2016-04-22 MED ORDER — SERTRALINE HCL 50 MG PO TABS: 50.0000 mg | ORAL_TABLET | Freq: Every day | ORAL | 0 refills | Status: DC

## 2016-04-22 MED FILL — SERTRALINE HCL 50 MG TABLET: 50 | 30 days supply | Qty: 30 | Fill #0

## 2016-04-22 NOTE — Telephone Encounter (Signed)
Patient requesting Zoloft. It was first prescribed by GYN she no longer sees GYN now wants you to fill it  Last seen 09/14/15 Never filled by you Sig: take 1 tab po at bedtime  #30   Please PC

## 2016-04-29 DIAGNOSIS — H10811 Pingueculitis, right eye: Secondary | ICD-10-CM | POA: Diagnosis not present

## 2016-04-29 DIAGNOSIS — H40013 Open angle with borderline findings, low risk, bilateral: Secondary | ICD-10-CM | POA: Diagnosis not present

## 2016-04-29 DIAGNOSIS — H11153 Pinguecula, bilateral: Secondary | ICD-10-CM | POA: Diagnosis not present

## 2016-04-29 DIAGNOSIS — H4710 Unspecified papilledema: Secondary | ICD-10-CM | POA: Diagnosis not present

## 2016-04-30 ENCOUNTER — Telehealth: Payer: Self-pay

## 2016-04-30 NOTE — Telephone Encounter (Signed)
-----   Message from Drema DallasAdam R Jaffe, DO sent at 04/30/2016  7:30 AM EST ----- I reviewed Dr. Wende MottWeaver's note.  Papilledema resolved.  She may discontinue acetazolamide.

## 2016-05-06 ENCOUNTER — Encounter: Payer: Self-pay | Admitting: Neurology

## 2016-05-06 DIAGNOSIS — Z3482 Encounter for supervision of other normal pregnancy, second trimester: Secondary | ICD-10-CM | POA: Diagnosis not present

## 2016-05-06 DIAGNOSIS — Z3483 Encounter for supervision of other normal pregnancy, third trimester: Secondary | ICD-10-CM | POA: Diagnosis not present

## 2016-05-15 ENCOUNTER — Encounter: Payer: Self-pay | Admitting: Neurology

## 2016-05-15 ENCOUNTER — Encounter: Payer: Self-pay | Admitting: *Deleted

## 2016-05-21 ENCOUNTER — Other Ambulatory Visit: Payer: Self-pay | Admitting: Family Medicine

## 2016-05-21 MED ORDER — SERTRALINE HCL 50 MG PO TABS: 50.0000 mg | ORAL_TABLET | Freq: Every day | ORAL | 4 refills | Status: DC

## 2016-05-21 MED FILL — SERTRALINE HCL 50 MG TABLET: 50 | 30 days supply | Qty: 30 | Fill #0

## 2016-05-27 ENCOUNTER — Ambulatory Visit: Payer: Self-pay | Admitting: Neurology

## 2016-05-28 ENCOUNTER — Ambulatory Visit: Payer: Self-pay | Admitting: Neurology

## 2016-06-19 ENCOUNTER — Ambulatory Visit (INDEPENDENT_AMBULATORY_CARE_PROVIDER_SITE_OTHER): Payer: 59 | Admitting: Neurology

## 2016-06-19 ENCOUNTER — Encounter: Payer: Self-pay | Admitting: Neurology

## 2016-06-19 VITALS — BP 110/60 | HR 84 | Ht 65.0 in | Wt 174.6 lb

## 2016-06-19 DIAGNOSIS — G932 Benign intracranial hypertension: Secondary | ICD-10-CM | POA: Diagnosis not present

## 2016-06-19 NOTE — Progress Notes (Signed)
NEUROLOGY FOLLOW UP OFFICE NOTE  Susan Craig [id number]  HISTORY OF PRESENT ILLNESS: Susan Craig is a 26 year old right-handed female with anxiety who follows up for idiopathic intracranial hypertension.  She is accompanied by her mother, who supplements history.   UPDATE: Repeat ophthalmologic exam in December showed resolution of papilledema.  Acetazolamide 500mg  twice daily was discontinued.  A few days later, she reported a ringing in her ears.  She was advised to restart acetazolamide at 250mg  twice daily.  She has been doing well.  She denies headache, visual changes or the ringing in her ears.   HISTORY: MRI of the brain and orbits with and without contrast from 05/31/15 was personally reviewed and were unremarkable.  She had a MRV of the head on 06/06/15, which was personally reviewed and revealed slightly hypoplastic left transverse sinus but no cerebral venous stenosis or thrombosis.  She underwent a lumbar puncture at that time, which revealed an opening pressure of 31 cm H2O.  CSF cell count, protein and glucose were unremarkable.  She was pregnant at that time and was advised not to initiate acetazolamide.  She continued to have headaches and persistent papilledema.  She underwent a therapeutic lumbar puncture on 08/01/15.  Opening pressure was 32 cm H2O.  Symptoms persisted and she was cleared by her obstetrician to start acetazolamide.  She underwent repeat ophthalmologic evaluation on 09/11/15, which still revealed bilateral papilledema with worsening retinal nerve fiber layer thickness. She started on acetazolamide 500mg  twice daily on 09/18/15.  She followed up with ophthalmology on 10/11/15, which showed bilateral blindspot enlargement.  Repeat eye exam with Dr. Alben Spittle in late July showed improving papilledema.  PAST MEDICAL HISTORY: Past Medical History:  Diagnosis Date  . Anxiety   . Chicken pox     MEDICATIONS: Current Outpatient Prescriptions on File Prior to Visit    Medication Sig Dispense Refill  . Prenatal Vit-Fe Fumarate-FA (PRENATAL MULTIVITAMIN) TABS tablet Take 1 tablet by mouth daily at 12 noon.    . sertraline (ZOLOFT) 50 MG tablet Take 1 tablet (50 mg total) by mouth at bedtime. 30 tablet 4   No current facility-administered medications on file prior to visit.     ALLERGIES: No Known Allergies  FAMILY HISTORY: Family History  Problem Relation Age of Onset  . Arthritis Paternal Grandmother   . Depression Paternal Grandmother   . Cancer Paternal Grandmother     pancreatic  . Lung cancer Maternal Grandmother     Smoker  . High blood pressure Paternal Grandfather   . Heart disease Paternal Grandfather     a fib-- cabg  . Depression Father   . Anxiety disorder Mother   . Anxiety disorder Sister   . COPD Maternal Grandfather     SOCIAL HISTORY: Social History   Social History  . Marital status: Married    Spouse name: N/A  . Number of children: N/A  . Years of education: N/A   Occupational History  . nurse [employer]   Social History Main Topics  . Smoking status: Never Smoker  . Smokeless tobacco: Never Used  . Alcohol use No     Comment: Occasionally  . Drug use: No  . Sexual activity: Yes    Partners: Male    Birth control/ protection: Pill   Other Topics Concern  . Not on file   Social History Narrative   Lives with husband in a 2 story home.  Currently pregnant.  No other children.  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          REVIEW OF SYSTEMS: Constitutional: No fevers, chills, or sweats, no generalized fatigue, change in appetite Eyes: No visual changes, double vision, eye pain Ear, nose and throat: No hearing loss, ear pain, nasal congestion, sore throat Cardiovascular: No chest pain, palpitations Respiratory:  No shortness of breath at rest or with exertion, wheezes GastrointestinaI: No nausea, vomiting, diarrhea, abdominal pain, fecal incontinence Genitourinary:  No  dysuria, urinary retention or frequency Musculoskeletal:  No neck pain, back pain Integumentary: No rash, pruritus, skin lesions Neurological: as above Psychiatric: No depression, insomnia, anxiety Endocrine: No palpitations, fatigue, diaphoresis, mood swings, change in appetite, change in weight, increased thirst Hematologic/Lymphatic:  No purpura, petechiae. Allergic/Immunologic: no itchy/runny eyes, nasal congestion, recent allergic reactions, rashes  PHYSICAL EXAM: Vitals:   06/19/16 0941  BP: 110/60  Pulse: 84   General: No acute distress.  Patient appears well-groomed.   Head:  Normocephalic/atraumatic Eyes:  Fundi examined but not visualized Neck: supple, no paraspinal tenderness, full range of motion Heart:  Regular rate and rhythm Lungs:  Clear to auscultation bilaterally Back: No paraspinal tenderness Neurological Exam: alert and oriented to person, place, and time. Attention span and concentration intact, recent and remote memory intact, fund of knowledge intact.  Speech fluent and not dysarthric, language intact.  CN II-XII intact. Bulk and tone normal, muscle strength 5/5 throughout.  Sensation to light touch  intact.  Deep tendon reflexes 2+ throughout.  Finger to nose testing intact.  Gait normal  IMPRESSION: Idiopathic intracranial hypertension resolved.    PLAN: 1.  We will discontinue acetazolamide.  I really do not think that the tinnitus was related to increased intracranial pressure.  If it recurs and does not resolve, then I would consider referral to ENT 2.  Otherwise, follow up in 6 months, after follow up with Dr. Alben SpittleWeaver.  17 minutes spent face to face with patient, over 50% spent discussing status update and the tinnitus.Shon Millet.  Deone Leifheit, DO  CC:  Seabron SpatesYvonne Lowne Chase, DO

## 2016-06-19 NOTE — Patient Instructions (Signed)
Stop the acetazolamide.  Let me know if you get the ringing again.  I don't think it is related to increased pressure in the head.   Follow up in 6 months.

## 2016-06-24 MED FILL — SERTRALINE HCL 50 MG TABLET: 50 | 30 days supply | Qty: 30 | Fill #1

## 2016-07-17 ENCOUNTER — Telehealth: Payer: Self-pay | Admitting: Family Medicine

## 2016-07-17 NOTE — Telephone Encounter (Signed)
[facility]. She said she lives 5 minutes away

## 2016-07-17 NOTE — Telephone Encounter (Signed)
Fine with me

## 2016-07-18 NOTE — Telephone Encounter (Signed)
Could you reach out to this patient to get her established with one of your providers? She is transferring due to location

## 2016-07-28 MED FILL — SERTRALINE HCL 50 MG TABLET: 50 | 30 days supply | Qty: 30 | Fill #2

## 2016-08-28 MED FILL — SERTRALINE HCL 50 MG TABLET: 50 | 30 days supply | Qty: 30 | Fill #3

## 2016-09-01 ENCOUNTER — Ambulatory Visit: Payer: Self-pay | Admitting: Primary Care

## 2016-09-01 ENCOUNTER — Telehealth: Payer: Self-pay | Admitting: *Deleted

## 2016-09-01 MED ORDER — AMOXICILLIN 500 MG PO CAPS: 1000.0000 mg | ORAL_CAPSULE | Freq: Three times a day (TID) | ORAL | 0 refills | Status: DC

## 2016-09-01 MED FILL — AMOXICILLIN 500 MG CAPSULE: 500 | 7 days supply | Qty: 42 | Fill #0

## 2016-09-01 NOTE — Telephone Encounter (Signed)
Patient c/o ear pain Dr Duke Salvia looked in ear, red and fluid Rx Amoxil 1,000 mg every 8 hours for 7 days, rx sent to [facility] pharmacy

## 2016-09-03 ENCOUNTER — Telehealth: Payer: 59 | Admitting: Family

## 2016-09-03 DIAGNOSIS — R05 Cough: Secondary | ICD-10-CM | POA: Diagnosis not present

## 2016-09-03 DIAGNOSIS — R059 Cough, unspecified: Secondary | ICD-10-CM

## 2016-09-03 NOTE — Progress Notes (Signed)
We are sorry that you are not feeling well.  Here is how we plan to help!  Based on what you have shared with me it looks like you have upper respiratory tract inflammation that has resulted in a significant cough.  Inflammation and infection in the upper respiratory tract is commonly called bronchitis and has four common causes:  Allergies, Viral Infections, Acid Reflux and Bacterial Infections.  Allergies, viruses and acid reflux are treated by controlling symptoms or eliminating the cause. An example might be a cough caused by taking certain blood pressure medications. You stop the cough by changing the medication. Another example might be a cough caused by acid reflux. Controlling the reflux helps control the cough.  Based on your presentation I believe you most likely have A cough due to bacteria.  When patients have a fever and a productive cough with a change in color or increased sputum production, we are concerned about bacterial bronchitis.  If left untreated it can progress to pneumonia.  If your symptoms do not improve with your treatment plan it is important that you contact your provider.   Continue Amoxicillin.     USE OF BRONCHODILATOR ("RESCUE") INHALERS: There is a risk from using your bronchodilator too frequently.  The risk is that over-reliance on a medication which only relaxes the muscles surrounding the breathing tubes can reduce the effectiveness of medications prescribed to reduce swelling and congestion of the tubes themselves.  Although you feel brief relief from the bronchodilator inhaler, your asthma may actually be worsening with the tubes becoming more swollen and filled with mucus.  This can delay other crucial treatments, such as oral steroid medications. If you need to use a bronchodilator inhaler daily, several times per day, you should discuss this with your provider.  There are probably better treatments that could be used to keep your asthma under control.     HOME  CARE . Only take medications as instructed by your medical team. . Complete the entire course of an antibiotic. . Drink plenty of fluids and get plenty of rest. . Avoid close contacts especially the very young and the elderly . Cover your mouth if you cough or cough into your sleeve. . Always remember to wash your hands . A steam or ultrasonic humidifier can help congestion.   GET HELP RIGHT AWAY IF: . You develop worsening fever. . You become short of breath . You cough up blood. . Your symptoms persist after you have completed your treatment plan MAKE SURE YOU   Understand these instructions.  Will watch your condition.  Will get help right away if you are not doing well or get worse.  Your e-visit answers were reviewed by a board certified advanced clinical practitioner to complete your personal care plan.  Depending on the condition, your plan could have included both over the counter or prescription medications. If there is a problem please reply  once you have received a response from your provider. Your safety is important to [place].  If you have drug allergies check your prescription carefully.    You can use MyChart to ask questions about today's visit, request a non-urgent call back, or ask for a work or school excuse for 24 hours related to this e-Visit. If it has been greater than 24 hours you will need to follow up with your provider, or enter a new e-Visit to address those concerns. You will get an e-mail in the next two days asking about your experience.  I hope that your e-visit has been valuable and will speed your recovery. Thank you for using e-visits.   

## 2016-09-15 ENCOUNTER — Ambulatory Visit: Payer: 59 | Admitting: Primary Care

## 2016-09-15 ENCOUNTER — Encounter: Payer: Self-pay | Admitting: Family Medicine

## 2016-09-29 MED FILL — SERTRALINE HCL 50 MG TABLET: 50 | 30 days supply | Qty: 30 | Fill #4

## 2016-10-01 ENCOUNTER — Encounter: Payer: Self-pay | Admitting: Primary Care

## 2016-10-01 ENCOUNTER — Ambulatory Visit (INDEPENDENT_AMBULATORY_CARE_PROVIDER_SITE_OTHER): Payer: 59 | Admitting: Primary Care

## 2016-10-01 VITALS — BP 104/66 | HR 93 | Temp 98.0°F | Ht 66.5 in | Wt 183.8 lb

## 2016-10-01 DIAGNOSIS — R519 Headache, unspecified: Secondary | ICD-10-CM

## 2016-10-01 DIAGNOSIS — Z0000 Encounter for general adult medical examination without abnormal findings: Secondary | ICD-10-CM | POA: Diagnosis not present

## 2016-10-01 DIAGNOSIS — F411 Generalized anxiety disorder: Secondary | ICD-10-CM

## 2016-10-01 DIAGNOSIS — R51 Headache: Secondary | ICD-10-CM

## 2016-10-01 NOTE — Assessment & Plan Note (Signed)
Tetanus UTD. Pap UTD, due in 2019. Discussed the importance of a healthy diet and regular exercise in order for weight loss, and to reduce the risk of other medical diseases. Exam unremarkable. Labs pending. Follow up in 1 year for annual exam.

## 2016-10-01 NOTE — Assessment & Plan Note (Signed)
No headaches since the delivery of her son in Fall 2017.

## 2016-10-01 NOTE — Assessment & Plan Note (Signed)
Previously managed on Paxil, now on Zoloft due to recent pregnancy. Doing well. Denies SI/HI.

## 2016-10-01 NOTE — Progress Notes (Signed)
Subjective:    Patient ID: Susan Craig, female    DOB: 13-Jan-1991, 26 y.o.   MRN: [id number]  HPI  [name] is a 26 year old female who presents today to transfer care from [facility] and also for complete physical.  1) Idiopathic Intracranial Hypertension: During recent pregnancy. Currently following with Neurology. She denies headaches since delivery of her son.  2) GAD: Long history of, previously managed on Paxil which was switched to Zoloft 50 mg after finding out she was pregnant. She feels well managed on Zoloft.  Immunizations: -Tetanus: Completed in 2014 -Influenza: Did complete last season  Diet: She endorses a fair diet. Breakfast: Protein shake, eggs, fruit Lunch: Vegetables, salad, grilled chicken, fruit Dinner: Vegetables, salad, grilled chicken, fruit Snacks: Raw veggies, chips, candy, sweets Desserts: Trying to reduce, once to twice weekly Beverages: Water, soda, coffee  Exercise: She does regularly exercise Eye exam: Completed in 2017 Dental exam: Completes semi-annually Pap Smear: Completed in 2016   Review of Systems  Constitutional: Negative for unexpected weight change.  HENT: Negative for rhinorrhea.   Respiratory: Negative for cough and shortness of breath.   Cardiovascular: Negative for chest pain.  Gastrointestinal: Negative for constipation and diarrhea.  Genitourinary: Negative for difficulty urinating and menstrual problem.  Musculoskeletal: Negative for arthralgias and myalgias.  Skin: Negative for rash.  Allergic/Immunologic: Negative for environmental allergies.  Neurological: Negative for dizziness, numbness and headaches.  Psychiatric/Behavioral:       Feels well managed on Zoloft.       Past Medical History:  Diagnosis Date  . Anxiety   . Chicken pox   . Hypertension      Social History   Social History  . Marital status: Married    Spouse name: N/A  . Number of children: N/A  . Years of education: N/A    Occupational History  . nurse [employer]   Social History Main Topics  . Smoking status: Never Smoker  . Smokeless tobacco: Never Used  . Alcohol use No     Comment: Occasionally  . Drug use: No  . Sexual activity: Yes    Partners: Male    Birth control/ protection: Pill   Other Topics Concern  . Not on file   Social History Narrative   Lives with husband in a 2 story home.  Currently pregnant.  No other children.  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          Past Surgical History:  Procedure Laterality Date  . NOSE SURGERY     septoplasty x2 and revision x1--- [facility]  . TONSILLECTOMY  2009    Family History  Problem Relation Age of Onset  . Arthritis Paternal Grandmother   . Depression Paternal Grandmother   . Cancer Paternal Grandmother        pancreatic  . Lung cancer Maternal Grandmother        Smoker  . High blood pressure Paternal Grandfather   . Heart disease Paternal Grandfather        a fib-- cabg  . Depression Father   . Anxiety disorder Mother   . Anxiety disorder Sister   . COPD Maternal Grandfather     No Known Allergies  Current Outpatient Prescriptions on File Prior to Visit  Medication Sig Dispense Refill  . Prenatal Vit-Fe Fumarate-FA (PRENATAL MULTIVITAMIN) TABS tablet Take 1 tablet by mouth daily at 12 noon.    . sertraline (  ZOLOFT) 50 MG tablet Take 1 tablet (50 mg total) by mouth at bedtime. 30 tablet 4   No current facility-administered medications on file prior to visit.     BP 104/66   Pulse 93   Temp 98 F (36.7 C) (Oral)   Ht 5' 6.5" (1.689 m) Comment: with shoes  Wt 183 lb 12.8 oz (83.4 kg)   SpO2 98%   BMI 29.22 kg/m    Objective:   Physical Exam  Constitutional: She is oriented to person, place, and time. She appears well-nourished.  HENT:  Right Ear: Tympanic membrane and ear canal normal.  Left Ear: Tympanic membrane and ear canal normal.  Nose: Nose normal.  Mouth/Throat:  Oropharynx is clear and moist.  Eyes: Conjunctivae and EOM are normal. Pupils are equal, round, and reactive to light.  Neck: Neck supple. No thyromegaly present.  Cardiovascular: Normal rate and regular rhythm.   No murmur heard. Pulmonary/Chest: Effort normal and breath sounds normal. She has no rales.  Abdominal: Soft. Bowel sounds are normal. There is no tenderness.  Musculoskeletal: Normal range of motion.  Lymphadenopathy:    She has no cervical adenopathy.  Neurological: She is alert and oriented to person, place, and time. She has normal reflexes. No cranial nerve deficit.  Skin: Skin is warm and dry. No rash noted.  Psychiatric: She has a normal mood and affect.          Assessment & Plan:

## 2016-10-01 NOTE — Patient Instructions (Signed)
Schedule a lab only appointment to return fasting for your labs. You may do this at your convenience.  Start exercising. You should be getting 150 minutes of moderate intensity exercise weekly.  Continue your efforts towards a healthy diet. Increase consumption of fresh vegetables and fruits, whole grains, water.  Ensure you are drinking 64 ounces of water daily.  We will repeat your pap next year.  Follow up in 1 year for your annual exam or sooner if needed.  It was a pleasure to meet you today! Please don't hesitate to call me with any questions. Welcome to [facility]!

## 2016-10-14 ENCOUNTER — Other Ambulatory Visit (INDEPENDENT_AMBULATORY_CARE_PROVIDER_SITE_OTHER): Payer: 59

## 2016-10-14 DIAGNOSIS — Z0000 Encounter for general adult medical examination without abnormal findings: Secondary | ICD-10-CM

## 2016-10-14 LAB — COMPREHENSIVE METABOLIC PANEL
ALBUMIN: 4.3 g/dL (ref 3.5–5.2)
ALT: 14 U/L (ref 0–35)
AST: 15 U/L (ref 0–37)
Alkaline Phosphatase: 80 U/L (ref 39–117)
BILIRUBIN TOTAL: 0.4 mg/dL (ref 0.2–1.2)
BUN: 17 mg/dL (ref 6–23)
CALCIUM: 9.2 mg/dL (ref 8.4–10.5)
CHLORIDE: 105 meq/L (ref 96–112)
CO2: 27 meq/L (ref 19–32)
CREATININE: 0.76 mg/dL (ref 0.40–1.20)
GFR: 97.46 mL/min (ref >60.00)
Glucose, Bld: 92 mg/dL (ref 70–99)
Potassium: 4.2 mEq/L (ref 3.5–5.1)
Sodium: 139 mEq/L (ref 135–145)
Total Protein: 6.9 g/dL (ref 6.0–8.3)

## 2016-10-14 LAB — LIPID PANEL
CHOLESTEROL: 176 mg/dL (ref 0–200)
HDL: 51.8 mg/dL (ref >39.00)
LDL CALC: 108 mg/dL — AB (ref 0–99)
NonHDL: 124.19
TRIGLYCERIDES: 80 mg/dL (ref 0.0–149.0)
Total CHOL/HDL Ratio: 3
VLDL: 16 mg/dL (ref 0.0–40.0)

## 2016-10-27 ENCOUNTER — Other Ambulatory Visit: Payer: Self-pay | Admitting: Family Medicine

## 2016-10-27 MED FILL — SERTRALINE HCL 50 MG TABLET: 50 | 30 days supply | Qty: 30 | Fill #0

## 2016-11-25 ENCOUNTER — Other Ambulatory Visit: Payer: Self-pay | Admitting: Family Medicine

## 2016-11-25 DIAGNOSIS — F411 Generalized anxiety disorder: Secondary | ICD-10-CM

## 2016-11-26 ENCOUNTER — Telehealth: Payer: Self-pay | Admitting: Family Medicine

## 2016-11-26 MED ORDER — SERTRALINE HCL 50 MG PO TABS: 50.0000 mg | ORAL_TABLET | Freq: Every day | ORAL | 5 refills | Status: DC

## 2016-11-26 MED ORDER — SERTRALINE HCL 50 MG PO TABS: 50.0000 mg | ORAL_TABLET | Freq: Every day | ORAL | 2 refills | Status: DC

## 2016-11-26 MED FILL — SERTRALINE HCL 50 MG TABLET: 50 | 30 days supply | Qty: 30 | Fill #0

## 2016-12-03 DIAGNOSIS — H40013 Open angle with borderline findings, low risk, bilateral: Secondary | ICD-10-CM | POA: Diagnosis not present

## 2016-12-03 DIAGNOSIS — H4710 Unspecified papilledema: Secondary | ICD-10-CM | POA: Diagnosis not present

## 2016-12-17 ENCOUNTER — Ambulatory Visit: Payer: Self-pay | Admitting: Neurology

## 2016-12-26 MED FILL — SERTRALINE HCL 50 MG TABLET: 50 | 30 days supply | Qty: 30 | Fill #1

## 2017-01-22 MED FILL — SERTRALINE HCL 50 MG TABLET: 50 | 30 days supply | Qty: 30 | Fill #2

## 2017-02-25 MED FILL — SERTRALINE HCL 50 MG TABLET: 50 | 30 days supply | Qty: 30 | Fill #3

## 2017-03-27 MED FILL — SERTRALINE HCL 50 MG TABLET: 50 | 30 days supply | Qty: 30 | Fill #4

## 2017-04-27 MED FILL — SERTRALINE HCL 50 MG TABLET: 50 | 30 days supply | Qty: 30 | Fill #5

## 2017-05-05 ENCOUNTER — Telehealth: Payer: 59 | Admitting: Nurse Practitioner

## 2017-05-05 DIAGNOSIS — J0100 Acute maxillary sinusitis, unspecified: Secondary | ICD-10-CM

## 2017-05-05 MED ORDER — AMOXICILLIN-POT CLAVULANATE 875-125 MG PO TABS: 1.0000 | ORAL_TABLET | Freq: Two times a day (BID) | ORAL | 0 refills | Status: DC

## 2017-05-05 NOTE — Addendum Note (Signed)
Addended by: Bennie PieriniMARTIN, MARY-MARGARET on: 05/05/2017 04:43 PM   Modules accepted: Orders

## 2017-05-05 NOTE — Progress Notes (Signed)

## 2017-05-05 NOTE — Addendum Note (Signed)
Addended by: Bennie PieriniMARTIN, MARY-MARGARET on: 05/05/2017 03:59 PM   Modules accepted: Orders

## 2017-05-07 ENCOUNTER — Encounter: Payer: Self-pay | Admitting: Primary Care

## 2017-05-07 DIAGNOSIS — H66002 Acute suppurative otitis media without spontaneous rupture of ear drum, left ear: Secondary | ICD-10-CM | POA: Diagnosis not present

## 2017-05-07 DIAGNOSIS — H9193 Unspecified hearing loss, bilateral: Secondary | ICD-10-CM | POA: Diagnosis not present

## 2017-05-07 MED FILL — METHYLPREDNISOLONE 4 MG TAB: 4 | 6 days supply | Qty: 21 | Fill #0

## 2017-05-10 IMAGING — MR MR HEAD WO/W CM
11 of 15 series · 26 of 48 positions shown · IV contrast (multihance)
Comparison: None.

CLINICAL DATA: Papilledema.  Blurred vision

EXAM:
MRI HEAD AND ORBITS WITHOUT AND WITH CONTRAST
TECHNIQUE: Multiplanar, multiecho pulse sequences of the brain and surrounding
structures were obtained without and with intravenous contrast.
Multiplanar, multiecho pulse sequences of the orbits and surrounding
structures were obtained including fat saturation techniques, before
and after intravenous contrast administration.
CONTRAST:  20mL MULTIHANCE GADOBENATE DIMEGLUMINE 529 MG/ML IV SOLN

[Series 3: DWI · axial · 3.6mm · 1.09mm/px · z∈[-122,+15]mm · 7 of 80 slices shown (1 of 2)]
[im 1/80]
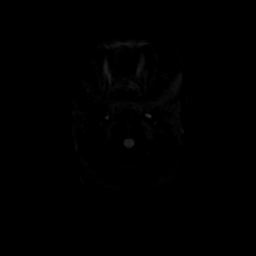
[im 14/80]
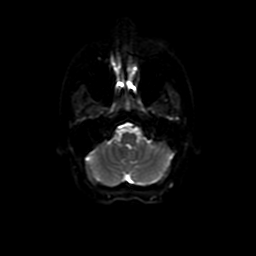
[im 27/80]
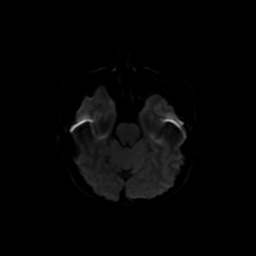
[im 40/80]
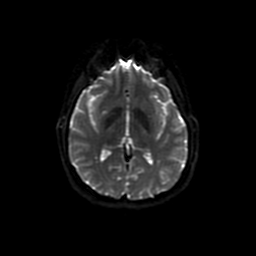
[im 53/80]
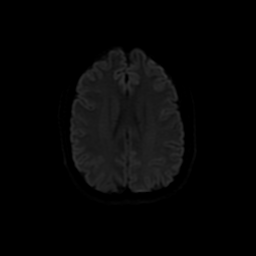
[im 66/80]
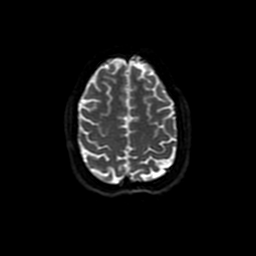
[im 80/80]
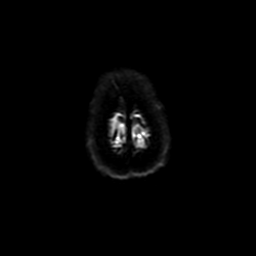

[Series 4: FLAIR · sagittal · 5.0mm · 0.47mm/px · 2 of 23 slices shown (1 of 2)]
[im 1/23]
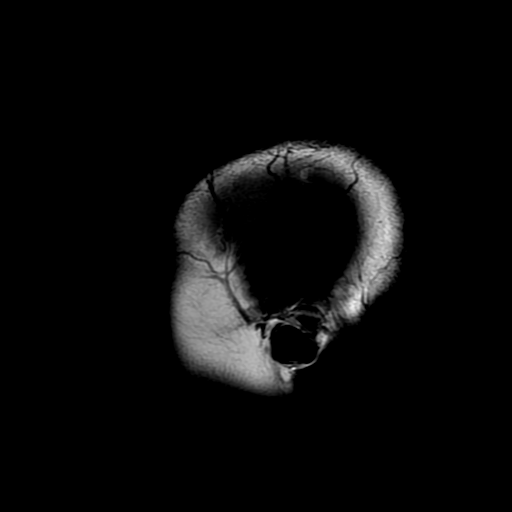
[im 23/23]
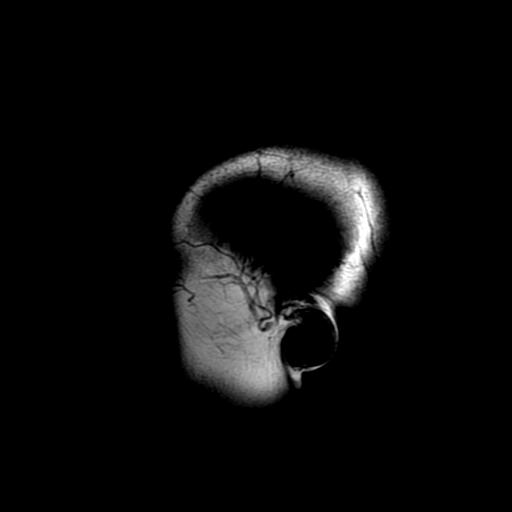

[Series 5: T2 · axial · 5.0mm · 0.43mm/px · z∈[-114,+21]mm · 2 of 24 slices shown (1 of 2)]
[im 1/24]
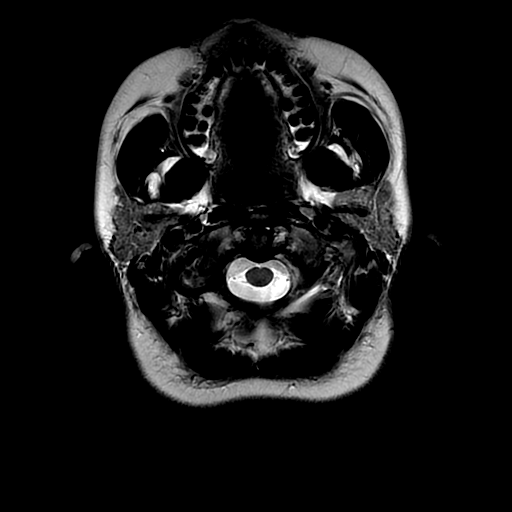
[im 24/24]
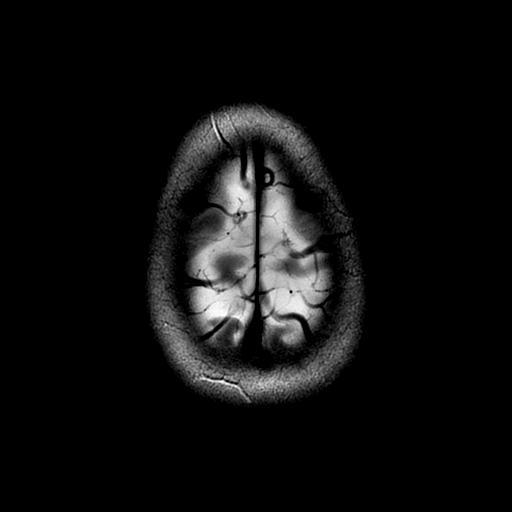

[Series 6: FLAIR · axial · 5.0mm · 0.43mm/px · z∈[-114,+21]mm · 2 of 24 slices shown (2 of 2)]
[im 1/24]
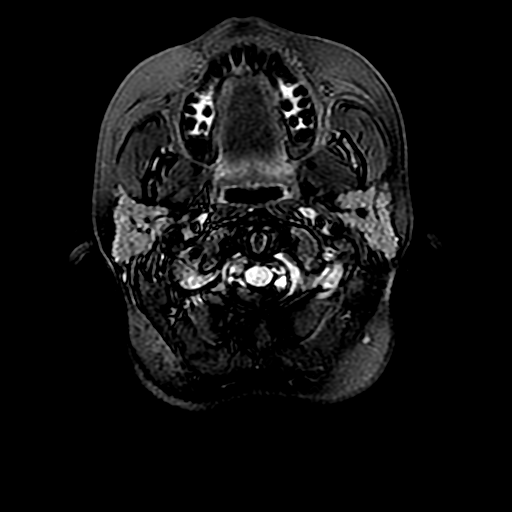
[im 24/24]
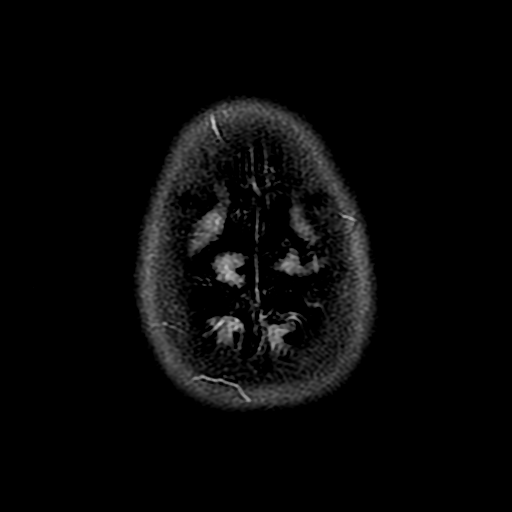

[Series 9: T2 · coronal · 5.0mm · 0.39mm/px · 2 of 32 slices shown (2 of 2)]
[im 1/32]
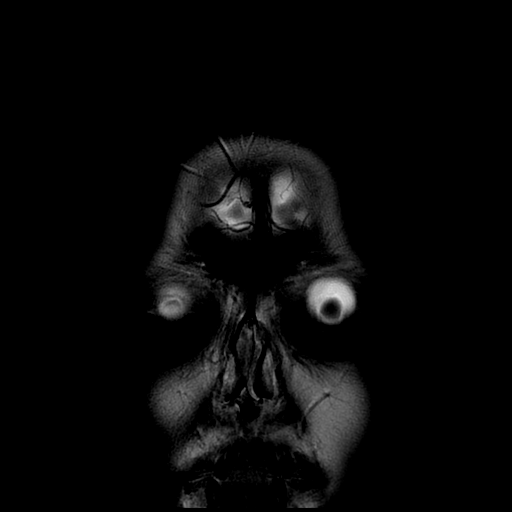
[im 32/32]
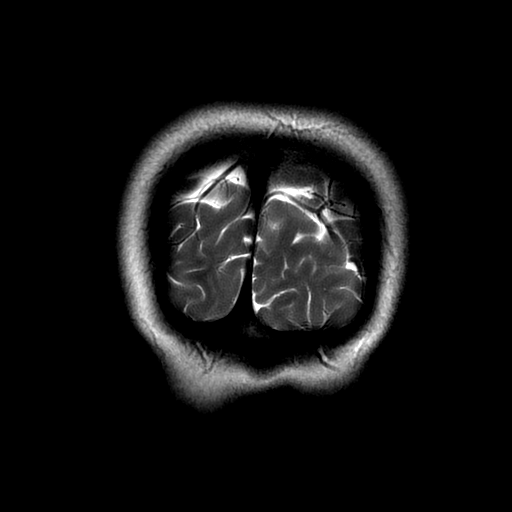

[Series 10: T2 fat-sat · coronal · 3.0mm · 0.35mm/px · 2 of 24 slices shown (1 of 2)]
[im 1/24]
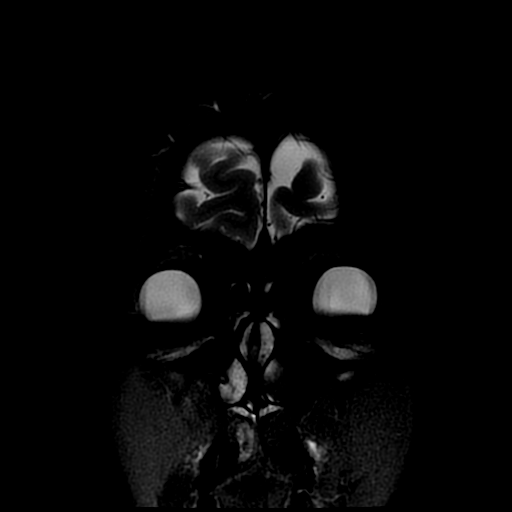
[im 24/24]
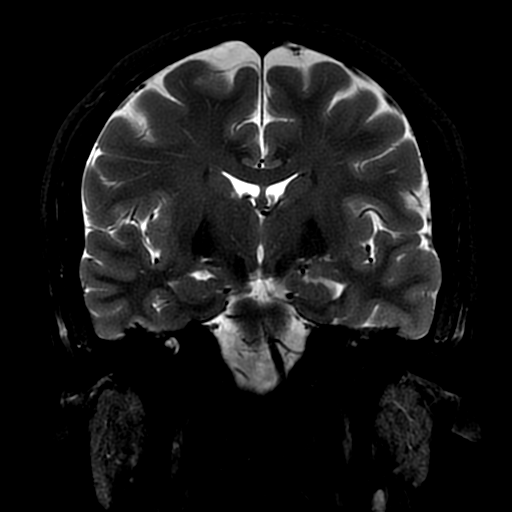

[Series 11: T1 · coronal · 3.0mm · 0.35mm/px · 2 of 24 slices shown (1 of 2)]
[im 1/24]
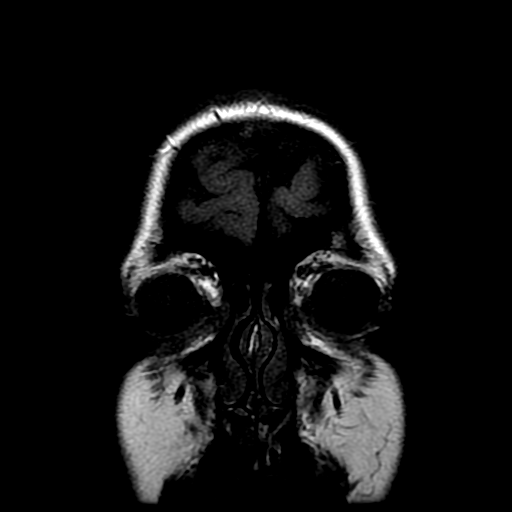
[im 24/24]
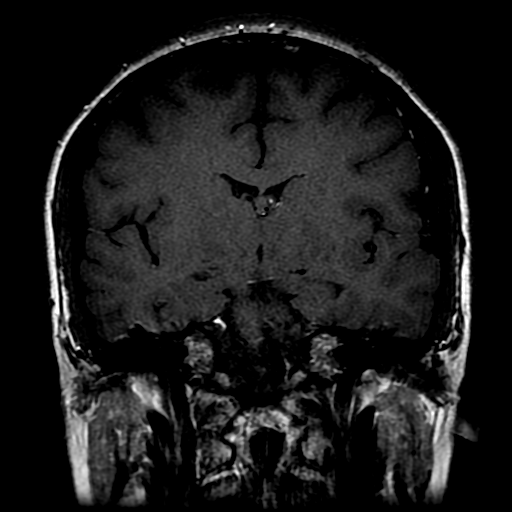

[Series 12: T2 fat-sat · axial · 3.0mm · 0.35mm/px · 1 of 16 slices shown (2 of 2)]
[im 1/16]
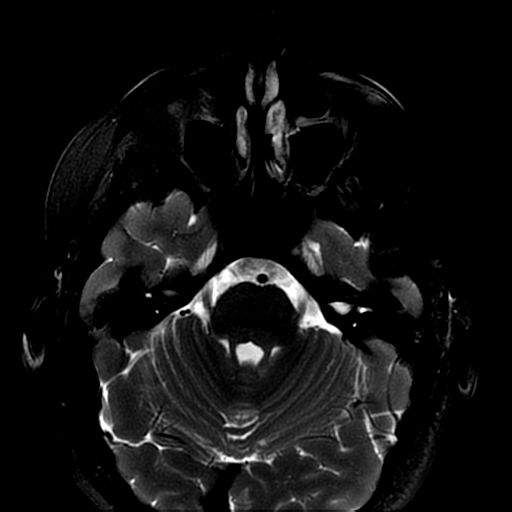

[Series 13: T1 · axial · 3.0mm · 0.35mm/px · 1 of 16 slices shown (2 of 2)]
[im 1/16]
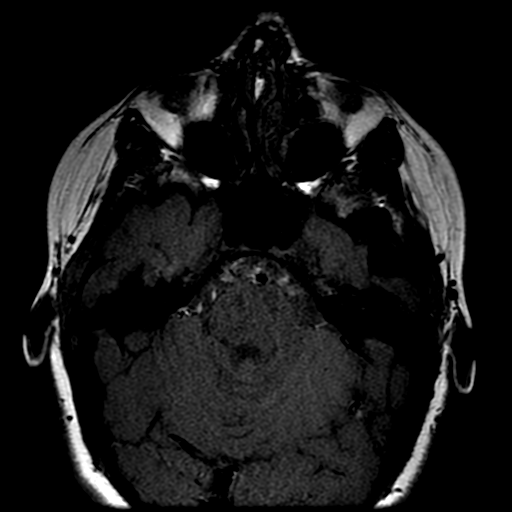

[Series 15: T1 post-contrast · coronal · 3.0mm · 0.35mm/px · 2 of 24 slices shown]
[im 1/24]
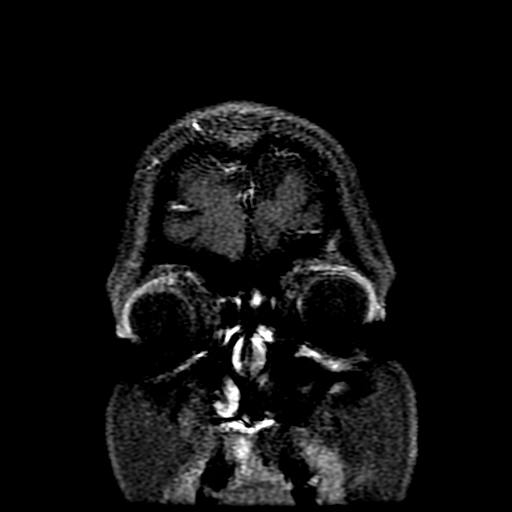
[im 24/24]
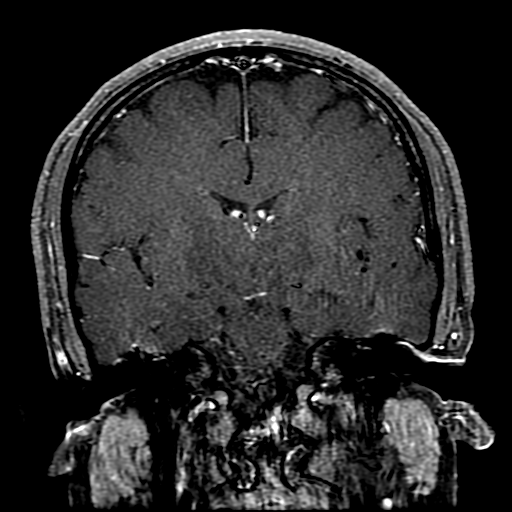

[Series 300: DWI · axial · 3.6mm · 1.09mm/px · z∈[-122,+15]mm · 3 of 40 slices shown (2 of 2)]
[im 1/40]
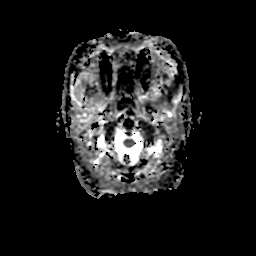
[im 20/40]
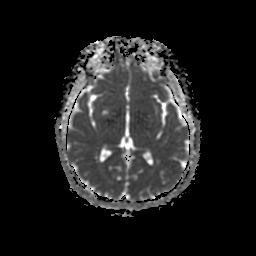
[im 40/40]
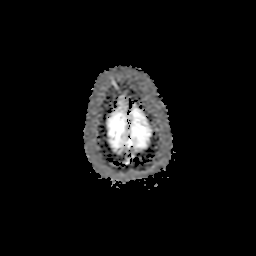

[26 of 48 positions shown; findings below may reference images not displayed]

FINDINGS: MRI HEAD FINDINGS

Ventricle size normal.  Cerebral volume normal.

Negative for acute or chronic infarction

Negative for demyelinating disease. Cerebral white matter normal.
Basal ganglia and brainstem normal.

Pituitary normal in size. Craniocervical junction normal. Negative
for Chiari malformation.

Paranasal sinuses clear.  Normal orbit.

Negative for hemorrhage or mass lesion.

Normal enhancement following contrast administration. No enhancing
mass lesion. Vascular enhancement is normal.

MRI ORBITS FINDINGS

The globe is symmetric in size with normal signal. Normal lens
location. Optic nerve normal in caliber and signal bilaterally.
Extraocular muscles normal. Orbital fat normal. Lacrimal gland
normal bilaterally. No at mass lesion in the orbit.

Pituitary normal in size.  Cavernous sinus normal.

Normal enhancement postcontrast. Extraocular muscles enhance
normally.

Paranasal sinuses are clear.
IMPRESSION: Normal MRI of the brain and orbit with contrast.

## 2017-05-20 ENCOUNTER — Telehealth: Payer: 59 | Admitting: Family

## 2017-05-20 DIAGNOSIS — B373 Candidiasis of vulva and vagina: Secondary | ICD-10-CM | POA: Diagnosis not present

## 2017-05-20 DIAGNOSIS — B3731 Acute candidiasis of vulva and vagina: Secondary | ICD-10-CM

## 2017-05-20 MED ORDER — FLUCONAZOLE 150 MG PO TABS: 150.0000 mg | ORAL_TABLET | Freq: Once | ORAL | 0 refills | Status: AC

## 2017-05-20 NOTE — Progress Notes (Signed)

## 2017-05-22 DIAGNOSIS — H9012 Conductive hearing loss, unilateral, left ear, with unrestricted hearing on the contralateral side: Secondary | ICD-10-CM | POA: Diagnosis not present

## 2017-05-22 DIAGNOSIS — H65112 Acute and subacute allergic otitis media (mucoid) (sanguinous) (serous), left ear: Secondary | ICD-10-CM | POA: Diagnosis not present

## 2017-05-28 MED FILL — SERTRALINE HCL 50 MG TABLET: 50 | 30 days supply | Qty: 30 | Fill #0

## 2017-06-24 ENCOUNTER — Encounter: Payer: Self-pay | Admitting: Primary Care

## 2017-06-24 DIAGNOSIS — F411 Generalized anxiety disorder: Secondary | ICD-10-CM

## 2017-06-24 MED ORDER — SERTRALINE HCL 50 MG PO TABS: 50.0000 mg | ORAL_TABLET | Freq: Every day | ORAL | 0 refills | Status: DC

## 2017-06-24 MED FILL — SERTRALINE HCL 50 MG TABLET: 50 | 90 days supply | Qty: 90 | Fill #0

## 2017-06-26 DIAGNOSIS — H4710 Unspecified papilledema: Secondary | ICD-10-CM | POA: Diagnosis not present

## 2017-06-26 DIAGNOSIS — H40013 Open angle with borderline findings, low risk, bilateral: Secondary | ICD-10-CM | POA: Diagnosis not present

## 2017-06-29 DIAGNOSIS — Z01419 Encounter for gynecological examination (general) (routine) without abnormal findings: Secondary | ICD-10-CM | POA: Diagnosis not present

## 2017-06-29 DIAGNOSIS — Z6833 Body mass index (BMI) 33.0-33.9, adult: Secondary | ICD-10-CM | POA: Diagnosis not present

## 2017-09-07 ENCOUNTER — Encounter: Payer: Self-pay | Admitting: Neurology

## 2017-09-10 ENCOUNTER — Encounter

## 2017-09-10 ENCOUNTER — Ambulatory Visit: Payer: Self-pay | Admitting: Neurology

## 2017-09-23 ENCOUNTER — Other Ambulatory Visit: Payer: Self-pay | Admitting: Primary Care

## 2017-09-23 DIAGNOSIS — F411 Generalized anxiety disorder: Secondary | ICD-10-CM

## 2017-09-23 MED ORDER — SERTRALINE HCL 50 MG PO TABS: 50.0000 mg | ORAL_TABLET | Freq: Every day | ORAL | 0 refills | Status: DC

## 2017-09-23 MED FILL — SERTRALINE HCL 50 MG TABLET: 50 | 90 days supply | Qty: 90 | Fill #0

## 2017-09-23 NOTE — Telephone Encounter (Signed)
Requesting refill zoloft to [facility]. Pt has CPX scheduled 09/28/17. Per DPR left v/m refill done as requested and pt to keep 09/28/17 appt.

## 2017-09-28 ENCOUNTER — Encounter: Payer: Self-pay | Admitting: Primary Care

## 2017-09-28 ENCOUNTER — Ambulatory Visit (INDEPENDENT_AMBULATORY_CARE_PROVIDER_SITE_OTHER): Payer: 59 | Admitting: Primary Care

## 2017-09-28 VITALS — BP 108/70 | HR 57 | Temp 97.7°F | Ht 66.5 in | Wt 204.8 lb

## 2017-09-28 DIAGNOSIS — R519 Headache, unspecified: Secondary | ICD-10-CM

## 2017-09-28 DIAGNOSIS — Z0000 Encounter for general adult medical examination without abnormal findings: Secondary | ICD-10-CM

## 2017-09-28 DIAGNOSIS — E669 Obesity, unspecified: Secondary | ICD-10-CM

## 2017-09-28 DIAGNOSIS — R51 Headache: Secondary | ICD-10-CM | POA: Diagnosis not present

## 2017-09-28 DIAGNOSIS — H4710 Unspecified papilledema: Secondary | ICD-10-CM | POA: Diagnosis not present

## 2017-09-28 DIAGNOSIS — F411 Generalized anxiety disorder: Secondary | ICD-10-CM

## 2017-09-28 LAB — COMPREHENSIVE METABOLIC PANEL
ALK PHOS: 52 U/L (ref 39–117)
ALT: 13 U/L (ref 0–35)
AST: 18 U/L (ref 0–37)
Albumin: 4.5 g/dL (ref 3.5–5.2)
BILIRUBIN TOTAL: 0.4 mg/dL (ref 0.2–1.2)
BUN: 15 mg/dL (ref 6–23)
CALCIUM: 9.5 mg/dL (ref 8.4–10.5)
CO2: 27 mEq/L (ref 19–32)
Chloride: 102 mEq/L (ref 96–112)
Creatinine, Ser: 0.81 mg/dL (ref 0.40–1.20)
GFR: 89.9 mL/min (ref >60.00)
Glucose, Bld: 88 mg/dL (ref 70–99)
Potassium: 3.9 mEq/L (ref 3.5–5.1)
Sodium: 137 mEq/L (ref 135–145)
TOTAL PROTEIN: 7.5 g/dL (ref 6.0–8.3)

## 2017-09-28 LAB — LIPID PANEL
Cholesterol: 152 mg/dL (ref 0–200)
HDL: 47.2 mg/dL (ref >39.00)
LDL Cholesterol: 81 mg/dL (ref 0–99)
NonHDL: 104.5
TRIGLYCERIDES: 118 mg/dL (ref 0.0–149.0)
Total CHOL/HDL Ratio: 3
VLDL: 23.6 mg/dL (ref 0.0–40.0)

## 2017-09-28 MED ORDER — SERTRALINE HCL 100 MG PO TABS: 100.0000 mg | ORAL_TABLET | Freq: Every day | ORAL | 0 refills | Status: DC

## 2017-09-28 NOTE — Progress Notes (Signed)
Subjective:    Patient ID: Susan Craig, female    DOB: 02/12/1991, 27 y.o.   MRN: [id number]  HPI  Susan Craig is a 27 year old female who presents today for complete physical.  Managed on Zoloft 50 mg, would like to discuss a dose increase given symptoms of  daily worry, feeling anxious/on edge. GAD 7 score of 13 today.   Immunizations: -Tetanus: Completed in 2014 -Influenza: Completed last season   Diet: She endorses a healthy diet. Breakfast: Protein shakes Lunch: Salad with protein Dinner: Meat, vegetable Snacks: Popcorn, pretzels, fruit Desserts: Graham crackers, whipped cream. 3-4 times weekly Beverages: Water, occasional soda  Exercise: She is doing exercise videos 6 days weekly.  Eye exam: Completed in 2019 Dental exam: Completes semi-annually  Pap Smear: Completed in 2017, follows with GYN.    Review of Systems  Constitutional: Negative for unexpected weight change.  HENT: Negative for rhinorrhea.   Respiratory: Negative for cough and shortness of breath.   Cardiovascular: Negative for chest pain.  Gastrointestinal: Negative for constipation and diarrhea.  Genitourinary: Negative for difficulty urinating and menstrual problem.  Musculoskeletal: Negative for arthralgias and myalgias.  Skin: Negative for rash.  Allergic/Immunologic: Negative for environmental allergies.  Neurological: Negative for dizziness, numbness and headaches.  Psychiatric/Behavioral: The patient is nervous/anxious.        See HPI       Past Medical History:  Diagnosis Date  . Anxiety   . Chicken pox   . Hypertension      Social History   Socioeconomic History  . Marital status: Married    Spouse name: Not on file  . Number of children: Not on file  . Years of education: Not on file  . Highest education level: Not on file  Occupational History  . Occupation: Academic librarian: [employer]  Social Needs  . Financial resource strain: Not on file  . Food insecurity:   Worry: Not on file    Inability: Not on file  . Transportation needs:    Medical: Not on file    Non-medical: Not on file  Tobacco Use  . Smoking status: Never Smoker  . Smokeless tobacco: Never Used  Substance and Sexual Activity  . Alcohol use: No    Alcohol/week: 0.0 oz    Comment: Occasionally  . Drug use: No  . Sexual activity: Yes    Partners: Male    Birth control/protection: Pill  Lifestyle  . Physical activity:    Days per week: Not on file    Minutes per session: Not on file  . Stress: Not on file  Relationships  . Social connections:    Talks on phone: Not on file    Gets together: Not on file    Attends religious service: Not on file    Active member of club or organization: Not on file    Attends meetings of clubs or organizations: Not on file    Relationship status: Not on file  . Intimate partner violence:    Fear of current or ex partner: Not on file    Emotionally abused: Not on file    Physically abused: Not on file    Forced sexual activity: Not on file  Other Topics Concern  . Not on file  Social History Narrative   Lives with husband in a 2 story home.  Currently pregnant.  No other children.  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          Past Surgical History:  Procedure Laterality Date  . NOSE SURGERY     septoplasty x2 and revision x1--- [facility]  . TONSILLECTOMY  2009    Family History  Problem Relation Age of Onset  . Arthritis Paternal Grandmother   . Depression Paternal Grandmother   . Cancer Paternal Grandmother        pancreatic  . Lung cancer Maternal Grandmother        Smoker  . High blood pressure Paternal Grandfather   . Heart disease Paternal Grandfather        a fib-- cabg  . Depression Father   . Anxiety disorder Mother   . Anxiety disorder Sister   . COPD Maternal Grandfather     No Known Allergies  Current Outpatient Medications on File Prior to Visit  Medication Sig Dispense Refill    . cetirizine (ZYRTEC) 10 MG tablet Take 10 mg by mouth daily.     No current facility-administered medications on file prior to visit.     BP 108/70   Pulse (!) 57   Temp 97.7 F (36.5 C) (Oral)   Ht 5' 6.5" (1.689 m)   Wt 204 lb 12 oz (92.9 kg)   SpO2 99%   BMI 32.55 kg/m    Objective:   Physical Exam  Constitutional: She is oriented to person, place, and time. She appears well-nourished.  HENT:  Right Ear: Tympanic membrane and ear canal normal.  Left Ear: Tympanic membrane and ear canal normal.  Nose: Nose normal.  Mouth/Throat: Oropharynx is clear and moist.  Eyes: Pupils are equal, round, and reactive to light. Conjunctivae and EOM are normal.  Neck: Neck supple. No thyromegaly present.  Cardiovascular: Normal rate and regular rhythm.  No murmur heard. Pulmonary/Chest: Effort normal and breath sounds normal. She has no rales.  Abdominal: Soft. Bowel sounds are normal. There is no tenderness.  Musculoskeletal: Normal range of motion.  Lymphadenopathy:    She has no cervical adenopathy.  Neurological: She is alert and oriented to person, place, and time. She has normal reflexes. No cranial nerve deficit.  Skin: Skin is warm and dry. No rash noted.  Psychiatric: She has a normal mood and affect.          Assessment & Plan:

## 2017-09-28 NOTE — Assessment & Plan Note (Signed)
No headaches, continue to monitor.

## 2017-09-28 NOTE — Patient Instructions (Addendum)
Stop by the lab prior to leaving today. I will notify you of your results once received.   We've increased the dose of your Zoloft to 100 mg. Will you please update me in 4 weeks via My Chart?  Continue exercising. You should be getting 150 minutes of moderate intensity exercise weekly.  Continue to work on a healthy diet.   Follow up in 1 year for your annual exam or sooner if needed.  It was a pleasure to see you today!   Preventive Care 18-39 Years, Female Preventive care refers to lifestyle choices and visits with your health care provider that can promote health and wellness. What does preventive care include?  A yearly physical exam. This is also called an annual well check.  Dental exams once or twice a year.  Routine eye exams. Ask your health care provider how often you should have your eyes checked.  Personal lifestyle choices, including: ? Daily care of your teeth and gums. ? Regular physical activity. ? Eating a healthy diet. ? Avoiding tobacco and drug use. ? Limiting alcohol use. ? Practicing safe sex. ? Taking vitamin and mineral supplements as recommended by your health care provider. What happens during an annual well check? The services and screenings done by your health care provider during your annual well check will depend on your age, overall health, lifestyle risk factors, and family history of disease. Counseling Your health care provider may ask you questions about your:  Alcohol use.  Tobacco use.  Drug use.  Emotional well-being.  Home and relationship well-being.  Sexual activity.  Eating habits.  Work and work Statistician.  Method of birth control.  Menstrual cycle.  Pregnancy history.  Screening You may have the following tests or measurements:  Height, weight, and BMI.  Diabetes screening. This is done by checking your blood sugar (glucose) after you have not eaten for a while (fasting).  Blood pressure.  Lipid and  cholesterol levels. These may be checked every 5 years starting at age 28.  Skin check.  Hepatitis C blood test.  Hepatitis B blood test.  Sexually transmitted disease (STD) testing.  BRCA-related cancer screening. This may be done if you have a family history of breast, ovarian, tubal, or peritoneal cancers.  Pelvic exam and Pap test. This may be done every 3 years starting at age 47. Starting at age 6, this may be done every 5 years if you have a Pap test in combination with an HPV test.  Discuss your test results, treatment options, and if necessary, the need for more tests with your health care provider. Vaccines Your health care provider may recommend certain vaccines, such as:  Influenza vaccine. This is recommended every year.  Tetanus, diphtheria, and acellular pertussis (Tdap, Td) vaccine. You may need a Td booster every 10 years.  Varicella vaccine. You may need this if you have not been vaccinated.  HPV vaccine. If you are 33 or younger, you may need three doses over 6 months.  Measles, mumps, and rubella (MMR) vaccine. You may need at least one dose of MMR. You may also need a second dose.  Pneumococcal 13-valent conjugate (PCV13) vaccine. You may need this if you have certain conditions and were not previously vaccinated.  Pneumococcal polysaccharide (PPSV23) vaccine. You may need one or two doses if you smoke cigarettes or if you have certain conditions.  Meningococcal vaccine. One dose is recommended if you are age 79-21 years and a first-year college student living in a  residence hall, or if you have one of several medical conditions. You may also need additional booster doses.  Hepatitis A vaccine. You may need this if you have certain conditions or if you travel or work in places where you may be exposed to hepatitis A.  Hepatitis B vaccine. You may need this if you have certain conditions or if you travel or work in places where you may be exposed to hepatitis  B.  Haemophilus influenzae type b (Hib) vaccine. You may need this if you have certain risk factors.  Talk to your health care provider about which screenings and vaccines you need and how often you need them. This information is not intended to replace advice given to you by your health care provider. Make sure you discuss any questions you have with your health care provider. Document Released: 06/24/2001 Document Revised: 01/16/2016 Document Reviewed: 02/27/2015 Elsevier Interactive Patient Education  [employer].

## 2017-09-28 NOTE — Assessment & Plan Note (Signed)
PHQ 9 score of 13 today. Given daily symptoms despite Zoloft 50 mg, will increase dose to 100 mg. She will update via My Chart in 4 weeks. Denies SI/HI.

## 2017-09-28 NOTE — Assessment & Plan Note (Signed)
Exercising regularly and improving diet, commended her on this accomplishment.

## 2017-09-28 NOTE — Assessment & Plan Note (Signed)
Immunizations UTD. Pap smear UTD per patient. Commended her on regular exercise and improvement in diet.  Exam unremarkable. Labs pending. Follow up in 1 year.

## 2017-09-28 NOTE — Assessment & Plan Note (Signed)
Following with ophthalmology. 

## 2017-10-20 ENCOUNTER — Encounter: Payer: Self-pay | Admitting: Primary Care

## 2017-10-20 DIAGNOSIS — F411 Generalized anxiety disorder: Secondary | ICD-10-CM

## 2017-10-21 MED ORDER — SERTRALINE HCL 50 MG PO TABS: 50.0000 mg | ORAL_TABLET | Freq: Every day | ORAL | 1 refills | Status: DC

## 2017-10-22 ENCOUNTER — Ambulatory Visit: Payer: 59 | Admitting: Neurology

## 2017-10-22 ENCOUNTER — Encounter: Payer: Self-pay | Admitting: Neurology

## 2017-10-22 ENCOUNTER — Encounter

## 2017-10-22 VITALS — BP 104/66 | HR 60 | Ht 65.0 in | Wt 198.0 lb

## 2017-10-22 DIAGNOSIS — G932 Benign intracranial hypertension: Secondary | ICD-10-CM

## 2017-10-22 NOTE — Progress Notes (Signed)
NEUROLOGY FOLLOW UP OFFICE NOTE  Susan Craig [id number]  HISTORY OF PRESENT ILLNESS: Susan Craig is a 27 year old right-handed female with anxiety who follows up for idiopathic intracranial hypertension.  She is accompanied by her mother, who supplements history.   UPDATE: She was last seen in February 2018.  At that time, acetazolamide was discontinued.  She feels well.  She denies headache, visual obscurations, or pulsatile tinnitus.  She has been following up with Dr. Alben Spittle for rechecks.  She last saw Dr. Alben Spittle about a couple of months ago and demonstrated no edema.  She does plan to possibly become pregnant next year.   HISTORY: In January 2017, she started experiencing daily headaches.  She also reported blurred vision, worse in the right eye.  She saw an ophthalmologist who found her to have bilateral papilledema.     MRI of the brain and orbits with and without contrast from 05/31/15 was personally reviewed and were unremarkable.  She had a MRV of the head on 06/06/15, which was personally reviewed and revealed slightly hypoplastic left transverse sinus but no cerebral venous stenosis or thrombosis.  She underwent a lumbar puncture at that time, which revealed an opening pressure of 31 cm H2O.  CSF cell count, protein and glucose were unremarkable.  She was pregnant at that time and was advised not to initiate acetazolamide.  She continued to have headaches and persistent papilledema.  She underwent a therapeutic lumbar puncture on 08/01/15.  Opening pressure was 32 cm H2O.  Symptoms persisted and she was cleared by her obstetrician to start acetazolamide.  She underwent repeat ophthalmologic evaluation on 09/11/15, which still revealed bilateral papilledema with worsening retinal nerve fiber layer thickness. She started on acetazolamide 500mg  twice daily on 09/18/15.  She followed up with ophthalmology on 10/11/15, which showed bilateral blindspot enlargement.  Headaches and clinical visual  disturbance have since resolved.  PAST MEDICAL HISTORY: Past Medical History:  Diagnosis Date  . Anxiety   . Chicken pox   . Hypertension     MEDICATIONS: Current Outpatient Medications on File Prior to Visit  Medication Sig Dispense Refill  . cetirizine (ZYRTEC) 10 MG tablet Take 10 mg by mouth daily.    . sertraline (ZOLOFT) 50 MG tablet Take 1 tablet (50 mg total) by mouth daily. 90 tablet 1   No current facility-administered medications on file prior to visit.     ALLERGIES: No Known Allergies  FAMILY HISTORY: Family History  Problem Relation Age of Onset  . Arthritis Paternal Grandmother   . Depression Paternal Grandmother   . Cancer Paternal Grandmother        pancreatic  . Lung cancer Maternal Grandmother        Smoker  . High blood pressure Paternal Grandfather   . Heart disease Paternal Grandfather        a fib-- cabg  . Depression Father   . Anxiety disorder Mother   . Anxiety disorder Sister   . COPD Maternal Grandfather     SOCIAL HISTORY: Social History   Socioeconomic History  . Marital status: Married    Spouse name: Not on file  . Number of children: Not on file  . Years of education: Not on file  . Highest education level: Not on file  Occupational History  . Occupation: Academic librarian: [employer]  Social Needs  . Financial resource strain: Not on file  . Food insecurity:    Worry: Not on file  Inability: Not on file  . Transportation needs:    Medical: Not on file    Non-medical: Not on file  Tobacco Use  . Smoking status: Never Smoker  . Smokeless tobacco: Never Used  Substance and Sexual Activity  . Alcohol use: No    Alcohol/week: 0.0 oz    Comment: Occasionally  . Drug use: No  . Sexual activity: Yes    Partners: Male    Birth control/protection: Pill  Lifestyle  . Physical activity:    Days per week: Not on file    Minutes per session: Not on file  . Stress: Not on file  Relationships  . Social connections:     Talks on phone: Not on file    Gets together: Not on file    Attends religious service: Not on file    Active member of club or organization: Not on file    Attends meetings of clubs or organizations: Not on file    Relationship status: Not on file  . Intimate partner violence:    Fear of current or ex partner: Not on file    Emotionally abused: Not on file    Physically abused: Not on file    Forced sexual activity: Not on file  Other Topics Concern  . Not on file  Social History Narrative   Lives with husband in a 2 story home.  Currently pregnant.  No other children.  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          REVIEW OF SYSTEMS: Constitutional: No fevers, chills, or sweats, no generalized fatigue, change in appetite Eyes: No visual changes, double vision, eye pain Ear, nose and throat: No hearing loss, ear pain, nasal congestion, sore throat Cardiovascular: No chest pain, palpitations Respiratory:  No shortness of breath at rest or with exertion, wheezes GastrointestinaI: No nausea, vomiting, diarrhea, abdominal pain, fecal incontinence Genitourinary:  No dysuria, urinary retention or frequency Musculoskeletal:  No neck pain, back pain Integumentary: No rash, pruritus, skin lesions Neurological: as above Psychiatric: No depression, insomnia, anxiety Endocrine: No palpitations, fatigue, diaphoresis, mood swings, change in appetite, change in weight, increased thirst Hematologic/Lymphatic:  No purpura, petechiae. Allergic/Immunologic: no itchy/runny eyes, nasal congestion, recent allergic reactions, rashes  PHYSICAL EXAM: Vitals:   10/22/17 0809  BP: 104/66  Pulse: 60  SpO2: 98%   General: No acute distress.  Patient appears well-groomed.  Head:  Normocephalic/atraumatic Eyes:  Fundi examined but not visualized Neck: supple, no paraspinal tenderness, full range of motion Heart:  Regular rate and rhythm Lungs:  Clear to auscultation  bilaterally Back: No paraspinal tenderness Neurological Exam: alert and oriented to person, place, and time. Attention span and concentration intact, recent and remote memory intact, fund of knowledge intact.  Speech fluent and not dysarthric, language intact.  CN II-XII intact. Bulk and tone normal, muscle strength 5/5 throughout.  Sensation to light touch, temperature and vibration intact.  Deep tendon reflexes 2+ throughout, toes downgoing.  Finger to nose and heel to shin testing intact.  Gait normal, Romberg negative.  IMPRESSION: Idiopathic intracranial hypertension, resolved  PLAN: 1.  She will follow up with Dr. Alben SpittleWeaver for routine exam next year. 2.  If she should become symptomatic, she will contact me and follow up with Dr. Alben SpittleWeaver sooner. 3.  Otherwise, she may follow up as needed.  17 minutes spent face to face with patient, over 50% spent discussing management.  Shon MilletAdam Brasen Bundren, DO  CC:  Alma Friendly, NP

## 2017-12-02 ENCOUNTER — Other Ambulatory Visit: Payer: Self-pay | Admitting: Primary Care

## 2017-12-02 DIAGNOSIS — F411 Generalized anxiety disorder: Secondary | ICD-10-CM

## 2017-12-03 ENCOUNTER — Encounter: Payer: Self-pay | Admitting: Primary Care

## 2017-12-04 MED FILL — SERTRALINE HCL 50 MG TABLET: 50 | 90 days supply | Qty: 90 | Fill #0

## 2017-12-04 NOTE — Telephone Encounter (Signed)
Pt states that the pharmacy did not receive the refill request. She states that she is going out of town tomorrow and would like to see if the medication can be sent today wit [facility]. CB#: [phone]

## 2018-01-09 ENCOUNTER — Telehealth: Payer: 59 | Admitting: Family

## 2018-01-09 DIAGNOSIS — J029 Acute pharyngitis, unspecified: Secondary | ICD-10-CM | POA: Diagnosis not present

## 2018-01-09 MED ORDER — PREDNISONE 5 MG PO TABS: 5.0000 mg | ORAL_TABLET | ORAL | 0 refills | Status: DC

## 2018-01-09 MED ORDER — BENZONATATE 100 MG PO CAPS: 100.0000 mg | ORAL_CAPSULE | Freq: Three times a day (TID) | ORAL | 0 refills | Status: DC | PRN

## 2018-01-09 NOTE — Progress Notes (Signed)
Thank you for the details you included in the comment boxes. Those details are very helpful in determining the best course of treatment for you and help us to provide the best care.  We are sorry that you are not feeling well.  Here is how we plan to help!  Based on your presentation I believe you most likely have A cough due to a virus.  This is called viral bronchitis and is best treated by rest, plenty of fluids and control of the cough.  You may use Ibuprofen or Tylenol as directed to help your symptoms.     In addition you may use A non-prescription cough medication called Mucinex DM: take 2 tablets every 12 hours. and A prescription cough medication called Tessalon Perles 100mg. You may take 1-2 capsules every 8 hours as needed for your cough.  Prednisone 5 mg daily for 6 days (see taper instructions below)  Directions for 6 day taper: Day 1: 2 tablets before breakfast, 1 after both lunch & dinner and 2 at bedtime Day 2: 1 tab before breakfast, 1 after both lunch & dinner and 2 at bedtime Day 3: 1 tab at each meal & 1 at bedtime Day 4: 1 tab at breakfast, 1 at lunch, 1 at bedtime Day 5: 1 tab at breakfast & 1 tab at bedtime Day 6: 1 tab at breakfast   From your responses in the eVisit questionnaire you describe inflammation in the upper respiratory tract which is causing a significant cough.  This is commonly called Bronchitis and has four common causes:    Allergies  Viral Infections  Acid Reflux  Bacterial Infection Allergies, viruses and acid reflux are treated by controlling symptoms or eliminating the cause. An example might be a cough caused by taking certain blood pressure medications. You stop the cough by changing the medication. Another example might be a cough caused by acid reflux. Controlling the reflux helps control the cough.  USE OF BRONCHODILATOR ("RESCUE") INHALERS: There is a risk from using your bronchodilator too frequently.  The risk is that over-reliance on  a medication which only relaxes the muscles surrounding the breathing tubes can reduce the effectiveness of medications prescribed to reduce swelling and congestion of the tubes themselves.  Although you feel brief relief from the bronchodilator inhaler, your asthma may actually be worsening with the tubes becoming more swollen and filled with mucus.  This can delay other crucial treatments, such as oral steroid medications. If you need to use a bronchodilator inhaler daily, several times per day, you should discuss this with your provider.  There are probably better treatments that could be used to keep your asthma under control.     HOME CARE . Only take medications as instructed by your medical team. . Complete the entire course of an antibiotic. . Drink plenty of fluids and get plenty of rest. . Avoid close contacts especially the very young and the elderly . Cover your mouth if you cough or cough into your sleeve. . Always remember to wash your hands . A steam or ultrasonic humidifier can help congestion.   GET HELP RIGHT AWAY IF: . You develop worsening fever. . You become short of breath . You cough up blood. . Your symptoms persist after you have completed your treatment plan MAKE SURE YOU   Understand these instructions.  Will watch your condition.  Will get help right away if you are not doing well or get worse.  Your e-visit answers were reviewed   by a board certified advanced clinical practitioner to complete your personal care plan.  Depending on the condition, your plan could have included both over the counter or prescription medications. If there is a problem please reply  once you have received a response from your provider. Your safety is important to us.  If you have drug allergies check your prescription carefully.    You can use MyChart to ask questions about today's visit, request a non-urgent call back, or ask for a work or school excuse for 24 hours related to this  e-Visit. If it has been greater than 24 hours you will need to follow up with your provider, or enter a new e-Visit to address those concerns. You will get an e-mail in the next two days asking about your experience.  I hope that your e-visit has been valuable and will speed your recovery. Thank you for using e-visits.    

## 2018-01-12 ENCOUNTER — Telehealth: Payer: 59 | Admitting: Family Medicine

## 2018-01-12 DIAGNOSIS — J019 Acute sinusitis, unspecified: Secondary | ICD-10-CM | POA: Diagnosis not present

## 2018-01-12 MED ORDER — AMOXICILLIN-POT CLAVULANATE 875-125 MG PO TABS: 1.0000 | ORAL_TABLET | Freq: Two times a day (BID) | ORAL | 0 refills | Status: AC

## 2018-01-12 MED FILL — AMOX-CLAV 875-125 MG TABLET: 875-125 | 7 days supply | Qty: 14 | Fill #0

## 2018-01-12 NOTE — Progress Notes (Signed)
We are sorry that you are not feeling well.  Here is how we plan to help!  Based on what you have shared with me it looks like you have sinusitis.  Sinusitis is inflammation and infection in the sinus cavities of the head.  Based on your presentation I believe you most likely have Acute Bacterial Sinusitis.  This is an infection caused by bacteria and is treated with antibiotics. I have prescribed Augmentin 875mg /125mg  one tablet twice daily with food, for 7 days. You may use an oral decongestant such as Mucinex D or if you have glaucoma or high blood pressure use plain Mucinex. Saline nasal spray help and can safely be used as often as needed for congestion.  If you develop worsening sinus pain, fever or notice severe headache and vision changes, or if symptoms are not better after completion of antibiotic, please schedule an appointment with a health care provider.    Sinus infections are not as easily transmitted as other respiratory infection, however we still recommend that you avoid close contact with loved ones, especially the very young and elderly.  Remember to wash your hands thoroughly throughout the day as this is the number one way to prevent the spread of infection!  Home Care:  Only take medications as instructed by your medical team.  Complete the entire course of an antibiotic.  Do not take these medications with alcohol.  A steam or ultrasonic humidifier can help congestion.  You can place a towel over your head and breathe in the steam from hot water coming from a faucet.  Avoid close contacts especially the very young and the elderly.  Cover your mouth when you cough or sneeze.  Always remember to wash your hands.  Get Help Right Away If:  You develop worsening fever or sinus pain.  You develop a severe head ache or visual changes.  Your symptoms persist after you have completed your treatment plan.  Make sure you  Understand these instructions.  Will watch your  condition.  Will get help right away if you are not doing well or get worse.  Your e-visit answers were reviewed by a board certified advanced clinical practitioner to complete your personal care plan.  Depending on the condition, your plan could have included both over the counter or prescription medications.  If there is a problem please reply  once you have received a response from your provider.  Your safety is important to [place].  If you have drug allergies check your prescription carefully.    You can use MyChart to ask questions about today's visit, request a non-urgent call back, or ask for a work or school excuse for 24 hours related to this e-Visit. If it has been greater than 24 hours you will need to follow up with your provider, or enter a new e-Visit to address those concerns.  You will get an e-mail in the next two days asking about your experience.  I hope that your e-visit has been valuable and will speed your recovery. Thank you for using e-visits.  We are sorry that you are not feeling well.  Here is how we plan to help!  Based on your presentation I believe you most likely have A cough due to bacteria.  When patients have a fever and a productive cough with a change in color or increased sputum production, we are concerned about bacterial bronchitis.  If left untreated it can progress to pneumonia.  If your symptoms do not  improve with your treatment plan it is important that you contact your provider.       HOME CARE . Only take medications as instructed by your medical team. . Complete the entire course of an antibiotic. . Drink plenty of fluids and get plenty of rest. . Avoid close contacts especially the very young and the elderly . Cover your mouth if you cough or cough into your sleeve. . Always remember to wash your hands . A steam or ultrasonic humidifier can help congestion.   GET HELP RIGHT AWAY IF: . You develop worsening fever. . You become short of  breath . You cough up blood. . Your symptoms persist after you have completed your treatment plan MAKE SURE YOU   Understand these instructions.  Will watch your condition.  Will get help right away if you are not doing well or get worse.

## 2018-01-22 ENCOUNTER — Encounter

## 2018-01-26 ENCOUNTER — Ambulatory Visit: Payer: 59 | Admitting: Primary Care

## 2018-02-19 ENCOUNTER — Ambulatory Visit: Payer: Self-pay | Admitting: Primary Care

## 2018-03-05 ENCOUNTER — Encounter: Payer: Self-pay | Admitting: Primary Care

## 2018-03-05 ENCOUNTER — Ambulatory Visit: Payer: 59 | Admitting: Primary Care

## 2018-03-05 VITALS — BP 114/72 | HR 60 | Temp 98.8°F | Ht 66.5 in | Wt 205.0 lb

## 2018-03-05 DIAGNOSIS — F411 Generalized anxiety disorder: Secondary | ICD-10-CM | POA: Diagnosis not present

## 2018-03-05 MED ORDER — CITALOPRAM HYDROBROMIDE 20 MG PO TABS: 20.0000 mg | ORAL_TABLET | Freq: Every day | ORAL | 1 refills | Status: DC

## 2018-03-05 MED FILL — CITALOPRAM HBR 20 MG TABLET: 20 | 30 days supply | Qty: 30 | Fill #0

## 2018-03-05 NOTE — Patient Instructions (Signed)
Stop sertraline (Zoloft) 50 mg. Start citalopram (Celexa) 20 mg tablets for anxiety. Take 1 tablet daily.  Please update me via My Chart in 4 weeks or sooner if you encounter any problems.   It was a pleasure to see you today!

## 2018-03-05 NOTE — Assessment & Plan Note (Signed)
Zoloft ineffective for anxiety treatment, could not tolerate increased dose. Switch to citalopram 20 mg. GAD 7 score of 18 today, denies SI/HI.  Discussed potential side effects, she will update via my chart in 4 weeks.

## 2018-03-05 NOTE — Progress Notes (Signed)
Subjective:    Patient ID: Susan Craig, female    DOB: 1990/10/14, 27 y.o.   MRN: [id number]  HPI  Susan Craig is a 27 year old female who presents today to discuss anxiety.   She is currently managed on sertraline 50 mg daily. She was last evaluated in May 2019 with reports of daily worry, feeling anxious/on edge. GAD 7 score of 13 at that time so her Zoloft was increased to 100 mg.   She updated [place] in early June 2019 reporting that she didn't like the dose increase, thought it was "too much". We reduced her back down to 50 mg.   Since her dose was decreased back down to 50 mg she's not feeling herself. She feels that she's overly emotional. Symptoms include feeling irritable, daily worry, nervous. Some days she feels better than others.   GAD 7 score of 18 and PHQ 9 score of 6 today. She denies SI/HI.   Review of Systems  Respiratory: Negative for shortness of breath.   Cardiovascular: Negative for chest pain.  Gastrointestinal: Negative for nausea.  Neurological: Negative for headaches.  Psychiatric/Behavioral: The patient is nervous/anxious.        See HPI       Past Medical History:  Diagnosis Date  . Anxiety   . Chicken pox   . Hypertension      Social History   Socioeconomic History  . Marital status: Married    Spouse name: Not on file  . Number of children: Not on file  . Years of education: Not on file  . Highest education level: Not on file  Occupational History  . Occupation: Academic librarian: [employer]  Social Needs  . Financial resource strain: Not on file  . Food insecurity:    Worry: Not on file    Inability: Not on file  . Transportation needs:    Medical: Not on file    Non-medical: Not on file  Tobacco Use  . Smoking status: Never Smoker  . Smokeless tobacco: Never Used  Substance and Sexual Activity  . Alcohol use: No    Alcohol/week: 0.0 standard drinks    Comment: Occasionally  . Drug use: No  . Sexual activity: Yes   Partners: Male    Birth control/protection: Pill  Lifestyle  . Physical activity:    Days per week: Not on file    Minutes per session: Not on file  . Stress: Not on file  Relationships  . Social connections:    Talks on phone: Not on file    Gets together: Not on file    Attends religious service: Not on file    Active member of club or organization: Not on file    Attends meetings of clubs or organizations: Not on file    Relationship status: Not on file  . Intimate partner violence:    Fear of current or ex partner: Not on file    Emotionally abused: Not on file    Physically abused: Not on file    Forced sexual activity: Not on file  Other Topics Concern  . Not on file  Social History Narrative   Lives with husband in a 2 story home.  Currently pregnant.  No other children.  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          Past Surgical History:  Procedure Laterality Date  . NOSE SURGERY  septoplasty x2 and revision x1--- [facility]  . TONSILLECTOMY  2009    Family History  Problem Relation Age of Onset  . Arthritis Paternal Grandmother   . Depression Paternal Grandmother   . Cancer Paternal Grandmother        pancreatic  . Lung cancer Maternal Grandmother        Smoker  . High blood pressure Paternal Grandfather   . Heart disease Paternal Grandfather        a fib-- cabg  . Depression Father   . Anxiety disorder Mother   . Anxiety disorder Sister   . COPD Maternal Grandfather     No Known Allergies  Current Outpatient Medications on File Prior to Visit  Medication Sig Dispense Refill  . benzonatate (TESSALON PERLES) 100 MG capsule Take 1-2 capsules (100-200 mg total) by mouth every 8 (eight) hours as needed for cough. 30 capsule 0  . predniSONE (DELTASONE) 5 MG tablet Take 1 tablet (5 mg total) by mouth as directed. Taper 6,5,4,3,2,1 21 tablet 0   No current facility-administered medications on file prior to visit.     BP  114/72   Pulse 60   Temp 98.8 F (37.1 C) (Oral)   Ht 5' 6.5" (1.689 m)   Wt 205 lb (93 kg)   SpO2 99%   BMI 32.59 kg/m    Objective:   Physical Exam  Constitutional: She appears well-nourished.  Neck: Neck supple.  Cardiovascular: Normal rate and regular rhythm.  Respiratory: Effort normal and breath sounds normal.  Skin: Skin is warm and dry.  Psychiatric: She has a normal mood and affect.           Assessment & Plan:

## 2018-03-12 ENCOUNTER — Telehealth: Payer: 59 | Admitting: Family

## 2018-03-12 DIAGNOSIS — J028 Acute pharyngitis due to other specified organisms: Secondary | ICD-10-CM

## 2018-03-12 DIAGNOSIS — B9689 Other specified bacterial agents as the cause of diseases classified elsewhere: Secondary | ICD-10-CM

## 2018-03-12 MED ORDER — PREDNISONE 5 MG PO TABS: 5.0000 mg | ORAL_TABLET | ORAL | 0 refills | Status: DC

## 2018-03-12 MED ORDER — DOXYCYCLINE HYCLATE 100 MG PO TABS: 100.0000 mg | ORAL_TABLET | Freq: Two times a day (BID) | ORAL | 0 refills | Status: DC

## 2018-03-12 MED ORDER — BENZONATATE 100 MG PO CAPS: 100.0000 mg | ORAL_CAPSULE | Freq: Three times a day (TID) | ORAL | 0 refills | Status: DC | PRN

## 2018-03-12 NOTE — Progress Notes (Signed)
Thank you for the details you included in the comment boxes. Those details are very helpful in determining the best course of treatment for you and help [place] to provide the best care. This may still be viral but I am concerned about the progression from your head/sinuses into your lungs along with the ear involvement.  We are sorry that you are not feeling well.  Here is how we plan to help!  Based on your presentation I believe you most likely have A cough due to bacteria.  When patients have a fever and a productive cough with a change in color or increased sputum production, we are concerned about bacterial bronchitis.  If left untreated it can progress to pneumonia.  If your symptoms do not improve with your treatment plan it is important that you contact your provider.   I have prescribed Doxycycline 100 mg twice a day for 7 days     In addition you may use A non-prescription cough medication called Mucinex DM: take 2 tablets every 12 hours. and A prescription cough medication called Tessalon Perles 100mg . You may take 1-2 capsules every 8 hours as needed for your cough.  Prednisone 5 mg daily for 6 days (see taper instructions below)  Directions for 6 day taper: Day 1: 2 tablets before breakfast, 1 after both lunch & dinner and 2 at bedtime Day 2: 1 tab before breakfast, 1 after both lunch & dinner and 2 at bedtime Day 3: 1 tab at each meal & 1 at bedtime Day 4: 1 tab at breakfast, 1 at lunch, 1 at bedtime Day 5: 1 tab at breakfast & 1 tab at bedtime Day 6: 1 tab at breakfast   From your responses in the eVisit questionnaire you describe inflammation in the upper respiratory tract which is causing a significant cough.  This is commonly called Bronchitis and has four common causes:    Allergies  Viral Infections  Acid Reflux  Bacterial Infection Allergies, viruses and acid reflux are treated by controlling symptoms or eliminating the cause. An example might be a cough caused by taking  certain blood pressure medications. You stop the cough by changing the medication. Another example might be a cough caused by acid reflux. Controlling the reflux helps control the cough.  USE OF BRONCHODILATOR ("RESCUE") INHALERS: There is a risk from using your bronchodilator too frequently.  The risk is that over-reliance on a medication which only relaxes the muscles surrounding the breathing tubes can reduce the effectiveness of medications prescribed to reduce swelling and congestion of the tubes themselves.  Although you feel brief relief from the bronchodilator inhaler, your asthma may actually be worsening with the tubes becoming more swollen and filled with mucus.  This can delay other crucial treatments, such as oral steroid medications. If you need to use a bronchodilator inhaler daily, several times per day, you should discuss this with your provider.  There are probably better treatments that could be used to keep your asthma under control.     HOME CARE . Only take medications as instructed by your medical team. . Complete the entire course of an antibiotic. . Drink plenty of fluids and get plenty of rest. . Avoid close contacts especially the very young and the elderly . Cover your mouth if you cough or cough into your sleeve. . Always remember to wash your hands . A steam or ultrasonic humidifier can help congestion.   GET HELP RIGHT AWAY IF: . You develop worsening fever. Marland Kitchen  You become short of breath . You cough up blood. . Your symptoms persist after you have completed your treatment plan MAKE SURE YOU   Understand these instructions.  Will watch your condition.  Will get help right away if you are not doing well or get worse.  Your e-visit answers were reviewed by a board certified advanced clinical practitioner to complete your personal care plan.  Depending on the condition, your plan could have included both over the counter or prescription medications. If there is a  problem please reply  once you have received a response from your provider. Your safety is important to [place].  If you have drug allergies check your prescription carefully.    You can use MyChart to ask questions about today's visit, request a non-urgent call back, or ask for a work or school excuse for 24 hours related to this e-Visit. If it has been greater than 24 hours you will need to follow up with your provider, or enter a new e-Visit to address those concerns. You will get an e-mail in the next two days asking about your experience.  I hope that your e-visit has been valuable and will speed your recovery. Thank you for using e-visits.

## 2018-04-02 MED FILL — CITALOPRAM HBR 20 MG TABLET: 20 | 30 days supply | Qty: 30 | Fill #1

## 2018-04-29 DIAGNOSIS — F411 Generalized anxiety disorder: Secondary | ICD-10-CM

## 2018-04-30 MED ORDER — CITALOPRAM HYDROBROMIDE 20 MG PO TABS: 20.0000 mg | ORAL_TABLET | Freq: Every day | ORAL | 3 refills | Status: DC

## 2018-04-30 MED FILL — CITALOPRAM HBR 20 MG TABLET: 20 | 90 days supply | Qty: 90 | Fill #0

## 2018-05-07 ENCOUNTER — Telehealth: Payer: 59 | Admitting: Family

## 2018-05-07 DIAGNOSIS — J020 Streptococcal pharyngitis: Secondary | ICD-10-CM

## 2018-05-07 MED ORDER — BENZONATATE 100 MG PO CAPS: 100.0000 mg | ORAL_CAPSULE | Freq: Three times a day (TID) | ORAL | 0 refills | Status: DC | PRN

## 2018-05-07 MED ORDER — AMOXICILLIN 500 MG PO CAPS: 500.0000 mg | ORAL_CAPSULE | Freq: Two times a day (BID) | ORAL | 0 refills | Status: DC

## 2018-05-07 MED FILL — BENZONATATE 100 MG CAP: 100 | 5 days supply | Qty: 30 | Fill #0

## 2018-05-07 MED FILL — AMOXICILLIN 500 MG CAPSULE: 500 | 10 days supply | Qty: 20 | Fill #0

## 2018-05-07 NOTE — Progress Notes (Signed)
Thank you for the details you included in the comment boxes. Those details are very helpful in determining the best course of treatment for you and help us to provide the best care. 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 That said, with the body aches and no high fever, with sore throat and white spots, this is veering toward strep. However, this also began as a sinus infection. All too often, in medicine, we look for "1" problem and there is often more than one. The other option is that this is viral sinusitis/pharyngitis (which can cause all the same symptoms, but usually not body aches.  Given your clinical experience, I am going to send you Amoxicillin in a regimen that treats strep throat, but will also work for sinusitis or pharyngitis. This could simply be a virus. Based on your clinical experience, if you truly feel this may be strep symptoms, go ahead and start the antibiotics today. If you are thinking it's more severe irritation from drainage due to the sinus infection, please wait another 24-48 hours as the standard of care dictates that would still be in the "virus" category. I am trusting your nursing judgment on this but want to make sure you have all the tools to treat this. See plan below.  We are sorry that you are not feeling well.  Here is how we plan to help!  Based on what you have shared with me it is likely that you have strep pharyngitis.  Strep pharyngitis is inflammation and infection in the back of the throat.  This is an infection cause by bacteria and is treated with antibiotics.  I have prescribed Amoxicillin 500mg  by mouth twice daily for 10 days.. For throat pain, we recommend over the counter oral pain relief medications such as acetaminophen or aspirin, or anti-inflammatory medications such as ibuprofen or naproxen sodium. Topical treatments such as oral  throat lozenges or sprays may be used as needed. Strep infections are not as easily transmitted as other respiratory infections, however we still recommend that you avoid close contact with loved ones, especially the very young and elderly.  Remember to wash your hands thoroughly throughout the day as this is the number one way to prevent the spread of infection and wipe down door knobs and counters with disinfectant. I also sent Tessalon perles 100mg , take 1 or 2 every 8 hours as needed for cough.    Home Care:  Only take medications as instructed by your medical team.  Complete the entire course of an antibiotic.  Do not take these medications with alcohol.  A steam or ultrasonic humidifier can help congestion.  You can place a towel over your head and breathe in the steam from hot water coming from a faucet.  Avoid close contacts especially the very young and the elderly.  Cover your mouth when you cough or sneeze.  Always remember to wash your hands.  Get Help Right Away If:  You develop worsening fever or sinus pain.  You develop a severe head ache or visual changes.  Your symptoms persist after you have completed your treatment plan.  Make sure you  Understand these instructions.  Will watch your condition.  Will get help right away if you are not doing well or get worse.  Your e-visit answers were reviewed by a board certified advanced clinical practitioner to complete your personal care  plan.  Depending on the condition, your plan could have included both over the counter or prescription medications.  If there is a problem please reply  once you have received a response from your provider.  Your safety is important to us.  If you have drug allergies check your prescription carefully.    You can use MyChart to ask questions about today's visit, request a non-urgent call back, or ask for a work or school excuse for 24 hours related to this e-Visit. If it has been  greater than 24 hours you will need to follow up with your provider, or enter a new e-Visit to address those concerns.  You will get an e-mail in the next two days asking about your experience.  I hope that your e-visit has been valuable and will speed your recovery. Thank you for using e-visits.

## 2018-07-30 MED FILL — CITALOPRAM HBR 20 MG TABLET: 20 | 90 days supply | Qty: 90 | Fill #1

## 2018-08-09 NOTE — Telephone Encounter (Signed)
Chan, Emily is out for the day, can you get her scheduled? 

## 2018-08-09 NOTE — Telephone Encounter (Signed)
Message left for patient to return my call.  

## 2018-08-09 NOTE — Telephone Encounter (Signed)
Irving Burton, please schedule a WebEx appointment at her convenience.

## 2018-08-10 ENCOUNTER — Other Ambulatory Visit: Payer: Self-pay

## 2018-08-10 ENCOUNTER — Encounter: Payer: Self-pay | Admitting: Primary Care

## 2018-08-10 ENCOUNTER — Ambulatory Visit (INDEPENDENT_AMBULATORY_CARE_PROVIDER_SITE_OTHER): Payer: No Typology Code available for payment source | Admitting: Primary Care

## 2018-08-10 DIAGNOSIS — J302 Other seasonal allergic rhinitis: Secondary | ICD-10-CM | POA: Diagnosis not present

## 2018-08-10 MED ORDER — OLOPATADINE HCL 0.1 % OP SOLN: 1.0000 [drp] | Freq: Two times a day (BID) | OPHTHALMIC | 0 refills | Status: DC

## 2018-08-10 NOTE — Patient Instructions (Signed)
Start olopatadine eye drops. Instill 1 drop into both eyes twice daily for one week as needed for allergies.   Resume Xyzal allergy medication at bedtime as discussed.  Please update me in 3-4 days if no improvement in your symptoms.  It was nice to see you! Mayra Reel, NP-C

## 2018-08-10 NOTE — Progress Notes (Signed)
Subjective:    Patient ID: Susan Craig, female    DOB: Oct 15, 1990, 28 y.o.   MRN: [id number]  HPI  Virtual Visit via Video Note  I connected with Susan Craig on 08/10/18 at 10:20 AM EDT by a video enabled telemedicine application and verified that I am speaking with the correct person using two identifiers.   I discussed the limitations of evaluation and management by telemedicine and the availability of in person appointments. The patient expressed understanding and agreed to proceed. She is at home, I am in the office.  History of Present Illness:  Susan Craig is a 28 year old female who presents today via video with a chief complaint of seasonal allergy symptoms.  Symptoms include, itchy/puffy eyes, itchy/constested nose, sneezing that began around the warmer Spring like weather for the last several weeks. She's tried taking Zyrtec, Allegra, Claritin, OTC allergy eye drops, Flonase, Benadryl without improvement. She's not ever seen an allergist. She denies fevers, cough, shortness of breath.    Observations/Objective:  Erythema and dry/raw skin to eyelids.  No obvious drainage or injection to conjunctiva/sclera. No cough. Doesn't appear sickly/ill.  Speaking in complete sentences.   Assessment and Plan:  Exam, HPI, and presentation consistent for allergy involvement. Her most bothersome symptom is her itchy/watery eyes. Will have her resume Xyzal nightly, add in Patanol allergy eye drops daily. If no improvement then will trial Singulair vs other allergy eye drop. She will update in 3-4 days.  Follow Up Instructions:  Start olopatadine eye drops. Instill 1 drop into both eyes twice daily for one week as needed for allergies.   Resume Xyzal allergy medication at bedtime as discussed.  Please update me in 3-4 days if no improvement in your symptoms.  It was nice to see you! Mayra Reel, NP-C    I discussed the assessment and treatment plan with the patient. The  patient was provided an opportunity to ask questions and all were answered. The patient agreed with the plan and demonstrated an understanding of the instructions.   The patient was advised to call back or seek an in-person evaluation if the symptoms worsen or if the condition fails to improve as anticipated.    Doreene Nest, NP    Review of Systems  Constitutional: Negative for fever.  HENT: Positive for rhinorrhea. Negative for congestion.   Eyes: Negative for pain, discharge and visual disturbance.       Eye lid dryness, watery eyes  Respiratory: Negative for cough and shortness of breath.   Allergic/Immunologic: Positive for environmental allergies.       Past Medical History:  Diagnosis Date  . Anxiety   . Chicken pox   . Hypertension      Social History   Socioeconomic History  . Marital status: Married    Spouse name: Not on file  . Number of children: Not on file  . Years of education: Not on file  . Highest education level: Not on file  Occupational History  . Occupation: Academic librarian: [employer]  Social Needs  . Financial resource strain: Not on file  . Food insecurity:    Worry: Not on file    Inability: Not on file  . Transportation needs:    Medical: Not on file    Non-medical: Not on file  Tobacco Use  . Smoking status: Never Smoker  . Smokeless tobacco: Never Used  Substance and Sexual Activity  . Alcohol use: No  Alcohol/week: 0.0 standard drinks    Comment: Occasionally  . Drug use: No  . Sexual activity: Yes    Partners: Male    Birth control/protection: Pill  Lifestyle  . Physical activity:    Days per week: Not on file    Minutes per session: Not on file  . Stress: Not on file  Relationships  . Social connections:    Talks on phone: Not on file    Gets together: Not on file    Attends religious service: Not on file    Active member of club or organization: Not on file    Attends meetings of clubs or organizations:  Not on file    Relationship status: Not on file  . Intimate partner violence:    Fear of current or ex partner: Not on file    Emotionally abused: Not on file    Physically abused: Not on file    Forced sexual activity: Not on file  Other Topics Concern  . Not on file  Social History Narrative   Lives with husband in a 2 story home.  Currently pregnant.  No other children.  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          Past Surgical History:  Procedure Laterality Date  . NOSE SURGERY     septoplasty x2 and revision x1--- [facility]  . TONSILLECTOMY  2009    Family History  Problem Relation Age of Onset  . Arthritis Paternal Grandmother   . Depression Paternal Grandmother   . Cancer Paternal Grandmother        pancreatic  . Lung cancer Maternal Grandmother        Smoker  . High blood pressure Paternal Grandfather   . Heart disease Paternal Grandfather        a fib-- cabg  . Depression Father   . Anxiety disorder Mother   . Anxiety disorder Sister   . COPD Maternal Grandfather     No Known Allergies  Current Outpatient Medications on File Prior to Visit  Medication Sig Dispense Refill  . citalopram (CELEXA) 20 MG tablet Take 1 tablet (20 mg total) by mouth daily. 90 tablet 3   No current facility-administered medications on file prior to visit.     There were no vitals taken for this visit.   Objective:   Physical Exam  Constitutional: She is oriented to person, place, and time.  Eyes: Conjunctivae are normal. Right eye exhibits no discharge. Left eye exhibits no discharge.  Erythema and dry/raw skin to eyelids.  No obvious drainage or injection to conjunctiva/sclera.  Respiratory: Effort normal.  Neurological: She is alert and oriented to person, place, and time.  Psychiatric: She has a normal mood and affect.           Assessment & Plan:

## 2018-08-10 NOTE — Assessment & Plan Note (Signed)
Exam, HPI, and presentation consistent for allergy involvement. Her most bothersome symptom is her itchy/watery eyes. Will have her resume Xyzal nightly, add in Patanol allergy eye drops daily. If no improvement then will trial Singulair vs other allergy eye drop. She will update in 3-4 days.

## 2018-08-10 NOTE — Telephone Encounter (Signed)
Patient called and appointment has bee made.

## 2018-11-02 MED FILL — CITALOPRAM HBR 20 MG TABLET: 20 | 90 days supply | Qty: 90 | Fill #2

## 2019-01-26 ENCOUNTER — Encounter: Payer: No Typology Code available for payment source | Admitting: Primary Care

## 2019-01-31 MED FILL — CITALOPRAM HBR 20 MG TABLET: 20 | 90 days supply | Qty: 90 | Fill #3

## 2019-05-02 ENCOUNTER — Other Ambulatory Visit: Payer: Self-pay | Admitting: Primary Care

## 2019-05-02 DIAGNOSIS — F411 Generalized anxiety disorder: Secondary | ICD-10-CM

## 2019-05-02 MED FILL — CITALOPRAM HBR 20 MG TABLET: 20 | 90 days supply | Qty: 90 | Fill #0

## 2019-05-05 MED FILL — CITALOPRAM HBR 20 MG TABLET: 20 | 90 days supply | Qty: 90 | Fill #0 | Status: TO

## 2019-05-05 MED FILL — CITALOPRAM HBR 20 MG TABLET: 20 | 90 days supply | Qty: 90 | Fill #0

## 2019-05-24 ENCOUNTER — Encounter: Payer: No Typology Code available for payment source | Admitting: Primary Care

## 2019-05-24 DIAGNOSIS — Z0000 Encounter for general adult medical examination without abnormal findings: Secondary | ICD-10-CM

## 2019-05-27 ENCOUNTER — Encounter: Payer: No Typology Code available for payment source | Admitting: Primary Care

## 2019-05-27 DIAGNOSIS — Z0289 Encounter for other administrative examinations: Secondary | ICD-10-CM

## 2019-05-31 NOTE — Telephone Encounter (Signed)
Thanks! Any time slot is fine, whatever works best with her schedule.

## 2019-05-31 NOTE — Telephone Encounter (Signed)
Patient called about rescheduling cpe She stated she has returned to work and has no symptoms.   She is wondering if she can be worked in on the 29th?  Can you look at your schedule and see if you can do this?

## 2019-06-01 NOTE — Telephone Encounter (Signed)
If it's [facility] then she should have no problem getting labs. Lab orders placed.

## 2019-06-01 NOTE — Telephone Encounter (Signed)
Left message for patient to call back. Patient requested the 29th for physical appt.  Please add on per [facility]

## 2019-06-01 NOTE — Telephone Encounter (Signed)
Patient called to schedule appt for the 29th She stated that she will be going to her job [employer] (there is a [facility]) to get the labs done. She just wanted to make sure orders are in

## 2019-06-03 DIAGNOSIS — Z0000 Encounter for general adult medical examination without abnormal findings: Secondary | ICD-10-CM

## 2019-06-07 LAB — COMPREHENSIVE METABOLIC PANEL
ALT: 13 IU/L (ref 0–32)
AST: 14 IU/L (ref 0–40)
Albumin/Globulin Ratio: 2.1 (ref 1.2–2.2)
Albumin: 4.9 g/dL (ref 3.9–5.0)
Alkaline Phosphatase: 59 IU/L (ref 39–117)
BUN/Creatinine Ratio: 20 (ref 9–23)
BUN: 16 mg/dL (ref 6–20)
Bilirubin Total: 0.5 mg/dL (ref 0.0–1.2)
CO2: 22 mmol/L (ref 20–29)
Calcium: 9.6 mg/dL (ref 8.7–10.2)
Chloride: 102 mmol/L (ref 96–106)
Creatinine, Ser: 0.81 mg/dL (ref 0.57–1.00)
GFR calc Af Amer: 114 mL/min/{1.73_m2} (ref >59)
GFR calc non Af Amer: 98 mL/min/{1.73_m2} (ref >59)
Globulin, Total: 2.3 g/dL (ref 1.5–4.5)
Glucose: 89 mg/dL (ref 65–99)
Potassium: 4.5 mmol/L (ref 3.5–5.2)
Sodium: 140 mmol/L (ref 134–144)
Total Protein: 7.2 g/dL (ref 6.0–8.5)

## 2019-06-07 LAB — LIPID PANEL
Chol/HDL Ratio: 2.7 ratio (ref 0.0–4.4)
Cholesterol, Total: 187 mg/dL (ref 100–199)
HDL: 70 mg/dL (ref >39)
LDL Chol Calc (NIH): 107 mg/dL — ABNORMAL HIGH (ref 0–99)
Triglycerides: 50 mg/dL (ref 0–149)
VLDL Cholesterol Cal: 10 mg/dL (ref 5–40)

## 2019-06-07 LAB — CBC
Hematocrit: 41.9 % (ref 34.0–46.6)
Hemoglobin: 14.1 g/dL (ref 11.1–15.9)
MCH: 31.2 pg (ref 26.6–33.0)
MCHC: 33.7 g/dL (ref 31.5–35.7)
MCV: 93 fL (ref 79–97)
Platelets: 358 10*3/uL (ref 150–450)
RBC: 4.52 x10E6/uL (ref 3.77–5.28)
RDW: 11.4 % — ABNORMAL LOW (ref 11.7–15.4)
WBC: 7.5 10*3/uL (ref 3.4–10.8)

## 2019-06-10 ENCOUNTER — Ambulatory Visit (INDEPENDENT_AMBULATORY_CARE_PROVIDER_SITE_OTHER): Payer: No Typology Code available for payment source | Admitting: Primary Care

## 2019-06-10 ENCOUNTER — Encounter: Payer: Self-pay | Admitting: Primary Care

## 2019-06-10 ENCOUNTER — Other Ambulatory Visit: Payer: Self-pay

## 2019-06-10 VITALS — BP 104/70 | HR 70 | Temp 96.6°F | Ht 66.5 in | Wt 182.5 lb

## 2019-06-10 DIAGNOSIS — Z0000 Encounter for general adult medical examination without abnormal findings: Secondary | ICD-10-CM | POA: Diagnosis not present

## 2019-06-10 DIAGNOSIS — J302 Other seasonal allergic rhinitis: Secondary | ICD-10-CM

## 2019-06-10 DIAGNOSIS — H4710 Unspecified papilledema: Secondary | ICD-10-CM

## 2019-06-10 DIAGNOSIS — G932 Benign intracranial hypertension: Secondary | ICD-10-CM

## 2019-06-10 DIAGNOSIS — F411 Generalized anxiety disorder: Secondary | ICD-10-CM

## 2019-06-10 NOTE — Assessment & Plan Note (Signed)
Doing well on citalopram, continue same. 

## 2019-06-10 NOTE — Progress Notes (Signed)
Subjective:    Patient ID: Susan Craig, female    DOB: 1991/05/09, 29 y.o.   MRN: [id number]  HPI  This visit occurred during the SARS-CoV-2 public health emergency.  Safety protocols were in place, including screening questions prior to the visit, additional usage of staff PPE, and extensive cleaning of exam room while observing appropriate contact time as indicated for disinfecting solutions.   Susan Craig is a 29 year old female who presents today for complete physical.  Immunizations: -Tetanus: Completed in 2014 -Influenza: Completed this season   Diet: She endorses a fair diet. Exercise: She is not exercising  Eye exam: Completed in 2020 Dental exam: No recent exam  Pap Smear: Completed in 2020  BP Readings from Last 3 Encounters:  06/10/19 104/70  03/05/18 114/72  10/22/17 104/66   Wt Readings from Last 3 Encounters:  06/10/19 182 lb 8 oz (82.8 kg)  03/05/18 205 lb (93 kg)  10/22/17 198 lb (89.8 kg)     Review of Systems  Constitutional: Negative for unexpected weight change.  HENT: Negative for rhinorrhea.   Respiratory: Negative for cough and shortness of breath.   Cardiovascular: Negative for chest pain.  Gastrointestinal: Negative for constipation and diarrhea.  Genitourinary: Negative for difficulty urinating.       IUD removed in December 2020  Musculoskeletal: Negative for arthralgias and myalgias.  Skin: Negative for rash.  Allergic/Immunologic: Negative for environmental allergies.  Neurological: Negative for dizziness, numbness and headaches.  Psychiatric/Behavioral: The patient is not nervous/anxious.        Past Medical History:  Diagnosis Date  . Anxiety   . Chicken pox   . Hypertension      Social History   Socioeconomic History  . Marital status: Married    Spouse name: Not on file  . Number of children: Not on file  . Years of education: Not on file  . Highest education level: Not on file  Occupational History  . Occupation:  Academic librarian: [employer]  Tobacco Use  . Smoking status: Never Smoker  . Smokeless tobacco: Never Used  Substance and Sexual Activity  . Alcohol use: No    Alcohol/week: 0.0 standard drinks    Comment: Occasionally  . Drug use: No  . Sexual activity: Yes    Partners: Male    Birth control/protection: Pill  Other Topics Concern  . Not on file  Social History Narrative   Lives with husband in a 2 story home.  Currently pregnant.  No other children.  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         Social Determinants of Health   Financial Resource Strain:   . Difficulty of Paying Living Expenses: Not on file  Food Insecurity:   . Worried About Programme researcher, broadcasting/film/video in the Last Year: Not on file  . Ran Out of Food in the Last Year: Not on file  Transportation Needs:   . Lack of Transportation (Medical): Not on file  . Lack of Transportation (Non-Medical): Not on file  Physical Activity:   . Days of Exercise per Week: Not on file  . Minutes of Exercise per Session: Not on file  Stress:   . Feeling of Stress : Not on file  Social Connections:   . Frequency of Communication with Friends and Family: Not on file  . Frequency of Social Gatherings with Friends and Family: Not on file  .  Attends Religious Services: Not on file  . Active Member of Clubs or Organizations: Not on file  . Attends Archivist Meetings: Not on file  . Marital Status: Not on file  Intimate Partner Violence:   . Fear of Current or Ex-Partner: Not on file  . Emotionally Abused: Not on file  . Physically Abused: Not on file  . Sexually Abused: Not on file    Past Surgical History:  Procedure Laterality Date  . NOSE SURGERY     septoplasty x2 and revision x1--- [facility]  . TONSILLECTOMY  2009    Family History  Problem Relation Age of Onset  . Arthritis Paternal Grandmother   . Depression Paternal Grandmother   . Cancer Paternal Grandmother        pancreatic    . Lung cancer Maternal Grandmother        Smoker  . High blood pressure Paternal Grandfather   . Heart disease Paternal Grandfather        a fib-- cabg  . Depression Father   . Anxiety disorder Mother   . Anxiety disorder Sister   . COPD Maternal Grandfather     No Known Allergies  Current Outpatient Medications on File Prior to Visit  Medication Sig Dispense Refill  . citalopram (CELEXA) 20 MG tablet TAKE 1 TABLET BY MOUTH DAILY. 90 tablet 0  . olopatadine (PATANOL) 0.1 % ophthalmic solution Place 1 drop into both eyes 2 (two) times daily. For allergies. 5 mL 0   No current facility-administered medications on file prior to visit.    BP 104/70   Pulse 70   Temp (!) 96.6 F (35.9 C) (Temporal)   Ht 5' 6.5" (1.689 m)   Wt 182 lb 8 oz (82.8 kg)   SpO2 100%   BMI 29.01 kg/m    Objective:   Physical Exam  Constitutional: She is oriented to person, place, and time. She appears well-nourished.  HENT:  Right Ear: Tympanic membrane and ear canal normal.  Left Ear: Tympanic membrane and ear canal normal.  Mouth/Throat: Oropharynx is clear and moist.  Eyes: Pupils are equal, round, and reactive to light. EOM are normal.  Cardiovascular: Normal rate and regular rhythm.  Respiratory: Effort normal and breath sounds normal.  GI: Soft. Bowel sounds are normal. There is no abdominal tenderness.  Musculoskeletal:        General: Normal range of motion.     Cervical back: Neck supple.  Neurological: She is alert and oriented to person, place, and time. No cranial nerve deficit.  Reflex Scores:      Patellar reflexes are 2+ on the right side and 2+ on the left side. Skin: Skin is warm and dry.  Psychiatric: She has a normal mood and affect.           Assessment & Plan:

## 2019-06-10 NOTE — Assessment & Plan Note (Signed)
Immunizations UTD. Pap smear UTD, follows with GYN. Encouraged regular exercise, healthy diet. Exam today unremarkable. Labs reviewed.

## 2019-06-10 NOTE — Assessment & Plan Note (Signed)
Doing well with PRN treatment. Continue same.

## 2019-06-10 NOTE — Patient Instructions (Signed)
Start exercising. You should be getting 150 minutes of moderate intensity exercise weekly.  Continue to work on a healthy diet. Ensure you are consuming 64 ounces of water daily.  It was a pleasure to see you today!   Preventive Care 74-29 Years Old, Female Preventive care refers to visits with your health care provider and lifestyle choices that can promote health and wellness. This includes:  A yearly physical exam. This may also be called an annual well check.  Regular dental visits and eye exams.  Immunizations.  Screening for certain conditions.  Healthy lifestyle choices, such as eating a healthy diet, getting regular exercise, not using drugs or products that contain nicotine and tobacco, and limiting alcohol use. What can I expect for my preventive care visit? Physical exam Your health care provider will check your:  Height and weight. This may be used to calculate body mass index (BMI), which tells if you are at a healthy weight.  Heart rate and blood pressure.  Skin for abnormal spots. Counseling Your health care provider may ask you questions about your:  Alcohol, tobacco, and drug use.  Emotional well-being.  Home and relationship well-being.  Sexual activity.  Eating habits.  Work and work Statistician.  Method of birth control.  Menstrual cycle.  Pregnancy history. What immunizations do I need?  Influenza (flu) vaccine  This is recommended every year. Tetanus, diphtheria, and pertussis (Tdap) vaccine  You may need a Td booster every 10 years. Varicella (chickenpox) vaccine  You may need this if you have not been vaccinated. Human papillomavirus (HPV) vaccine  If recommended by your health care provider, you may need three doses over 6 months. Measles, mumps, and rubella (MMR) vaccine  You may need at least one dose of MMR. You may also need a second dose. Meningococcal conjugate (MenACWY) vaccine  One dose is recommended if you are age  29-21 years and a first-year college student living in a residence hall, or if you have one of several medical conditions. You may also need additional booster doses. Pneumococcal conjugate (PCV13) vaccine  You may need this if you have certain conditions and were not previously vaccinated. Pneumococcal polysaccharide (PPSV23) vaccine  You may need one or two doses if you smoke cigarettes or if you have certain conditions. Hepatitis A vaccine  You may need this if you have certain conditions or if you travel or work in places where you may be exposed to hepatitis A. Hepatitis B vaccine  You may need this if you have certain conditions or if you travel or work in places where you may be exposed to hepatitis B. Haemophilus influenzae type b (Hib) vaccine  You may need this if you have certain conditions. You may receive vaccines as individual doses or as more than one vaccine together in one shot (combination vaccines). Talk with your health care provider about the risks and benefits of combination vaccines. What tests do I need?  Blood tests  Lipid and cholesterol levels. These may be checked every 5 years starting at age 97.  Hepatitis C test.  Hepatitis B test. Screening  Diabetes screening. This is done by checking your blood sugar (glucose) after you have not eaten for a while (fasting).  Sexually transmitted disease (STD) testing.  BRCA-related cancer screening. This may be done if you have a family history of breast, ovarian, tubal, or peritoneal cancers.  Pelvic exam and Pap test. This may be done every 3 years starting at age 38. Starting at  age 93, this may be done every 5 years if you have a Pap test in combination with an HPV test. Talk with your health care provider about your test results, treatment options, and if necessary, the need for more tests. Follow these instructions at home: Eating and drinking   Eat a diet that includes fresh fruits and vegetables, whole  grains, lean protein, and low-fat dairy.  Take vitamin and mineral supplements as recommended by your health care provider.  Do not drink alcohol if: ? Your health care provider tells you not to drink. ? You are pregnant, may be pregnant, or are planning to become pregnant.  If you drink alcohol: ? Limit how much you have to 0-1 drink a day. ? Be aware of how much alcohol is in your drink. In the [place] one drink equals one 12 oz bottle of beer (355 mL), one 5 oz glass of wine (148 mL), or one 1 oz glass of hard liquor (44 mL). Lifestyle  Take daily care of your teeth and gums.  Stay active. Exercise for at least 30 minutes on 5 or more days each week.  Do not use any products that contain nicotine or tobacco, such as cigarettes, e-cigarettes, and chewing tobacco. If you need help quitting, ask your health care provider.  If you are sexually active, practice safe sex. Use a condom or other form of birth control (contraception) in order to prevent pregnancy and STIs (sexually transmitted infections). If you plan to become pregnant, see your health care provider for a preconception visit. What's next?  Visit your health care provider once a year for a well check visit.  Ask your health care provider how often you should have your eyes and teeth checked.  Stay up to date on all vaccines. This information is not intended to replace advice given to you by your health care provider. Make sure you discuss any questions you have with your health care provider. Document Revised: 01/07/2018 Document Reviewed: 01/07/2018 Elsevier Patient Education  2020 [employer].

## 2019-06-10 NOTE — Assessment & Plan Note (Signed)
Stable per last MR in 2017. She will see neurology on a PRN basis if she becomes pregnant.

## 2019-06-10 NOTE — Assessment & Plan Note (Signed)
Following annually with ophthalmology.

## 2019-08-02 ENCOUNTER — Other Ambulatory Visit: Payer: Self-pay

## 2019-08-02 DIAGNOSIS — J302 Other seasonal allergic rhinitis: Secondary | ICD-10-CM

## 2019-08-02 DIAGNOSIS — F411 Generalized anxiety disorder: Secondary | ICD-10-CM

## 2019-08-02 MED ORDER — CITALOPRAM HYDROBROMIDE 20 MG PO TABS: 20.0000 mg | ORAL_TABLET | Freq: Every day | ORAL | 1 refills | Status: DC

## 2019-08-02 MED ORDER — OLOPATADINE HCL 0.1 % OP SOLN: 1.0000 [drp] | Freq: Two times a day (BID) | OPHTHALMIC | 11 refills | Status: DC

## 2019-09-06 NOTE — Telephone Encounter (Signed)
Carollee Herter,  This patient received a no show fee but she had Covid. Will you please take a look as I do not want her billed.

## 2019-09-07 NOTE — Telephone Encounter (Signed)
Email has been sent to billing to see if anything can be done to fix this charge.

## 2020-02-06 ENCOUNTER — Other Ambulatory Visit: Payer: Self-pay

## 2020-02-06 DIAGNOSIS — F411 Generalized anxiety disorder: Secondary | ICD-10-CM

## 2020-02-06 MED ORDER — CITALOPRAM HYDROBROMIDE 20 MG PO TABS: 20.0000 mg | ORAL_TABLET | Freq: Every day | ORAL | 1 refills | Status: DC

## 2020-05-12 NOTE — L&D Delivery Note (Signed)
Delivery Note At 10:26 PM a viable and healthy female was delivered via Vaginal, Vacuum (Extractor) (Presentation: Middle Occiput Posterior).  APGAR: 7, 9; weight  pending.   Placenta status: Spontaneous, Intact.  Cord: 3 vessels with the following complications: Elbert x one reducedNone.  Cord pH: na  Anesthesia: Local Episiotomy: Median Lacerations:  na Suture Repair: 2.0 vicryl rapide Est. Blood Loss (mL): 100  Mom to postpartum.  Baby to Couplet care / Skin to Skin.  Miroslav Gin J 04/16/2021, 11:00 PM

## 2020-07-17 ENCOUNTER — Telehealth: Payer: Self-pay

## 2020-07-17 NOTE — Telephone Encounter (Signed)
Pt would like to transfer her care from Vernona Rieger to Dr. Artis Flock, due to relocation. Please advise

## 2020-07-17 NOTE — Telephone Encounter (Signed)
Fine with me, thanks. 

## 2020-07-18 NOTE — Telephone Encounter (Signed)
LVM for patient to call back and schedule her Annual with Dr. Artis Flock. Ok per [place]. Just wont be able to do the TOC.

## 2020-07-18 NOTE — Telephone Encounter (Signed)
Im having to hold transfers/new patients at this time. Im so sorry! Orland Mustard, MD [facility]

## 2020-07-23 ENCOUNTER — Other Ambulatory Visit: Payer: Self-pay

## 2020-07-23 DIAGNOSIS — F411 Generalized anxiety disorder: Secondary | ICD-10-CM

## 2020-07-23 MED ORDER — CITALOPRAM HYDROBROMIDE 20 MG PO TABS: 20.0000 mg | ORAL_TABLET | Freq: Every day | ORAL | 0 refills | Status: DC

## 2020-07-26 NOTE — Telephone Encounter (Signed)
Patient called back and states she is unable to schedule anything at this moment, due to work schedule and will call back with an appointment.

## 2020-08-02 ENCOUNTER — Other Ambulatory Visit: Payer: Self-pay

## 2020-08-02 DIAGNOSIS — F411 Generalized anxiety disorder: Secondary | ICD-10-CM

## 2020-08-07 DIAGNOSIS — F411 Generalized anxiety disorder: Secondary | ICD-10-CM

## 2020-08-07 DIAGNOSIS — G932 Benign intracranial hypertension: Secondary | ICD-10-CM

## 2020-08-07 NOTE — Telephone Encounter (Signed)
My chart sent to patient to make appointment

## 2020-08-09 NOTE — Telephone Encounter (Signed)
Left message to return call to our office.  

## 2020-08-14 NOTE — Telephone Encounter (Signed)
Left message to return call to our office.  

## 2020-08-15 NOTE — Telephone Encounter (Signed)
Have sent my chart message from patient for review. Is doing TOC with Dr. Artis Flock due to location/move. She can not be seen in time for next refill and will be out 2-3 weeks after she runs out. Would like to get refill to last.

## 2020-08-17 MED ORDER — CITALOPRAM HYDROBROMIDE 20 MG PO TABS: 20.0000 mg | ORAL_TABLET | Freq: Every day | ORAL | 0 refills | Status: DC

## 2020-09-14 ENCOUNTER — Other Ambulatory Visit: Payer: Self-pay

## 2020-09-14 ENCOUNTER — Ambulatory Visit (INDEPENDENT_AMBULATORY_CARE_PROVIDER_SITE_OTHER): Payer: No Typology Code available for payment source | Admitting: Family Medicine

## 2020-09-14 ENCOUNTER — Encounter: Payer: Self-pay | Admitting: Family Medicine

## 2020-09-14 ENCOUNTER — Other Ambulatory Visit (HOSPITAL_COMMUNITY): Payer: Self-pay

## 2020-09-14 VITALS — BP 112/74 | HR 70 | Temp 97.6°F | Ht 66.5 in | Wt 213.2 lb

## 2020-09-14 DIAGNOSIS — F411 Generalized anxiety disorder: Secondary | ICD-10-CM

## 2020-09-14 DIAGNOSIS — Z1159 Encounter for screening for other viral diseases: Secondary | ICD-10-CM | POA: Diagnosis not present

## 2020-09-14 DIAGNOSIS — Z0000 Encounter for general adult medical examination without abnormal findings: Secondary | ICD-10-CM | POA: Diagnosis not present

## 2020-09-14 DIAGNOSIS — Z3A08 8 weeks gestation of pregnancy: Secondary | ICD-10-CM

## 2020-09-14 LAB — CBC WITH DIFFERENTIAL/PLATELET
Basophils Absolute: 0 10*3/uL (ref 0.0–0.1)
Basophils Relative: 0.4 % (ref 0.0–3.0)
Eosinophils Absolute: 0 10*3/uL (ref 0.0–0.7)
Eosinophils Relative: 0.3 % (ref 0.0–5.0)
HCT: 41.2 % (ref 36.0–46.0)
Hemoglobin: 14.1 g/dL (ref 12.0–15.0)
Lymphocytes Relative: 18.8 % (ref 12.0–46.0)
Lymphs Abs: 2.5 10*3/uL (ref 0.7–4.0)
MCHC: 34.1 g/dL (ref 30.0–36.0)
MCV: 91.4 fl (ref 78.0–100.0)
Monocytes Absolute: 0.6 10*3/uL (ref 0.1–1.0)
Monocytes Relative: 4.5 % (ref 3.0–12.0)
Neutro Abs: 10 10*3/uL — ABNORMAL HIGH (ref 1.4–7.7)
Neutrophils Relative %: 76 % (ref 43.0–77.0)
Platelets: 309 10*3/uL (ref 150.0–400.0)
RBC: 4.51 Mil/uL (ref 3.87–5.11)
RDW: 13.2 % (ref 11.5–15.5)
WBC: 13.2 10*3/uL — ABNORMAL HIGH (ref 4.0–10.5)

## 2020-09-14 LAB — LIPID PANEL
Cholesterol: 177 mg/dL (ref 0–200)
HDL: 62.8 mg/dL (ref >39.00)
LDL Cholesterol: 101 mg/dL — ABNORMAL HIGH (ref 0–99)
NonHDL: 114.17
Total CHOL/HDL Ratio: 3
Triglycerides: 65 mg/dL (ref 0.0–149.0)
VLDL: 13 mg/dL (ref 0.0–40.0)

## 2020-09-14 LAB — COMPREHENSIVE METABOLIC PANEL
ALT: 15 U/L (ref 0–35)
AST: 15 U/L (ref 0–37)
Albumin: 4.6 g/dL (ref 3.5–5.2)
Alkaline Phosphatase: 59 U/L (ref 39–117)
BUN: 12 mg/dL (ref 6–23)
CO2: 27 mEq/L (ref 19–32)
Calcium: 9.7 mg/dL (ref 8.4–10.5)
Chloride: 102 mEq/L (ref 96–112)
Creatinine, Ser: 0.63 mg/dL (ref 0.40–1.20)
GFR: 119.11 mL/min (ref >60.00)
Glucose, Bld: 87 mg/dL (ref 70–99)
Potassium: 4.1 mEq/L (ref 3.5–5.1)
Sodium: 137 mEq/L (ref 135–145)
Total Bilirubin: 0.5 mg/dL (ref 0.2–1.2)
Total Protein: 7.3 g/dL (ref 6.0–8.3)

## 2020-09-14 LAB — TSH: TSH: 0.68 u[IU]/mL (ref 0.35–4.50)

## 2020-09-14 MED ORDER — CITALOPRAM HYDROBROMIDE 20 MG PO TABS
20.0000 mg | ORAL_TABLET | Freq: Every day | ORAL | 3 refills | Status: DC
  Filled 2020-09-14: qty 90, 90d supply, fill #0
  Filled 2020-12-31: qty 90, 90d supply, fill #1
  Filled 2021-03-31: qty 90, 90d supply, fill #2
  Filled 2021-07-01: qty 90, 90d supply, fill #3

## 2020-09-14 NOTE — Patient Instructions (Signed)
Preventive Care 21-30 Years Old, Female Preventive care refers to lifestyle choices and visits with your health care provider that can promote health and wellness. This includes:  A yearly physical exam. This is also called an annual wellness visit.  Regular dental and eye exams.  Immunizations.  Screening for certain conditions.  Healthy lifestyle choices, such as: ? Eating a healthy diet. ? Getting regular exercise. ? Not using drugs or products that contain nicotine and tobacco. ? Limiting alcohol use. What can I expect for my preventive care visit? Physical exam Your health care provider may check your:  Height and weight. These may be used to calculate your BMI (body mass index). BMI is a measurement that tells if you are at a healthy weight.  Heart rate and blood pressure.  Body temperature.  Skin for abnormal spots. Counseling Your health care provider may ask you questions about your:  Past medical problems.  Family's medical history.  Alcohol, tobacco, and drug use.  Emotional well-being.  Home life and relationship well-being.  Sexual activity.  Diet, exercise, and sleep habits.  Work and work environment.  Access to firearms.  Method of birth control.  Menstrual cycle.  Pregnancy history. What immunizations do I need? Vaccines are usually given at various ages, according to a schedule. Your health care provider will recommend vaccines for you based on your age, medical history, and lifestyle or other factors, such as travel or where you work.   What tests do I need? Blood tests  Lipid and cholesterol levels. These may be checked every 5 years starting at age 20.  Hepatitis C test.  Hepatitis B test. Screening  Diabetes screening. This is done by checking your blood sugar (glucose) after you have not eaten for a while (fasting).  STD (sexually transmitted disease) testing, if you are at risk.  BRCA-related cancer screening. This may be  done if you have a family history of breast, ovarian, tubal, or peritoneal cancers.  Pelvic exam and Pap test. This may be done every 3 years starting at age 21. Starting at age 30, this may be done every 5 years if you have a Pap test in combination with an HPV test. Talk with your health care provider about your test results, treatment options, and if necessary, the need for more tests.   Follow these instructions at home: Eating and drinking  Eat a healthy diet that includes fresh fruits and vegetables, whole grains, lean protein, and low-fat dairy products.  Take vitamin and mineral supplements as recommended by your health care provider.  Do not drink alcohol if: ? Your health care provider tells you not to drink. ? You are pregnant, may be pregnant, or are planning to become pregnant.  If you drink alcohol: ? Limit how much you have to 0-1 drink a day. ? Be aware of how much alcohol is in your drink. In the [place] one drink equals one 12 oz bottle of beer (355 mL), one 5 oz glass of wine (148 mL), or one 1 oz glass of hard liquor (44 mL).   Lifestyle  Take daily care of your teeth and gums. Brush your teeth every morning and night with fluoride toothpaste. Floss one time each day.  Stay active. Exercise for at least 30 minutes 5 or more days each week.  Do not use any products that contain nicotine or tobacco, such as cigarettes, e-cigarettes, and chewing tobacco. If you need help quitting, ask your health care provider.  Do not   use drugs.  If you are sexually active, practice safe sex. Use a condom or other form of protection to prevent STIs (sexually transmitted infections).  If you do not wish to become pregnant, use a form of birth control. If you plan to become pregnant, see your health care provider for a prepregnancy visit.  Find healthy ways to cope with stress, such as: ? Meditation, yoga, or listening to music. ? Journaling. ? Talking to a trusted  person. ? Spending time with friends and family. Safety  Always wear your seat belt while driving or riding in a vehicle.  Do not drive: ? If you have been drinking alcohol. Do not ride with someone who has been drinking. ? When you are tired or distracted. ? While texting.  Wear a helmet and other protective equipment during sports activities.  If you have firearms in your house, make sure you follow all gun safety procedures.  Seek help if you have been physically or sexually abused. What's next?  Go to your health care provider once a year for an annual wellness visit.  Ask your health care provider how often you should have your eyes and teeth checked.  Stay up to date on all vaccines. This information is not intended to replace advice given to you by your health care provider. Make sure you discuss any questions you have with your health care provider. Document Revised: 12/25/2019 Document Reviewed: 01/07/2018 Elsevier Patient Education  2021 Elsevier Inc.  

## 2020-09-14 NOTE — Progress Notes (Signed)
Patient: Susan Craig MRN: [id number] DOB: [birth year] PCP: Doreene Nest, NP     Subjective:  Chief Complaint  Patient presents with  . Annual Exam  . Anxiety    HPI: The patient is a 30 y.o. female who presents today for annual exam. She denies any changes to past medical history. There have been no recent hospitalizations. She is not following a well balanced diet and exercise plan. Weight has been stable. No complaints today. Pt is [redacted] weeks pregnant. She saw her OB this Monday with 8 week ultrasound and continued celexa.   No family hx of breast or colon cancer.   Hx of GAD. Currently on celexa 20mg . Was on zoloft in previous pregnancy and was changed over to celexa. She is not exercising, does not do counseling.    Immunization History  Administered Date(s) Administered  . Influenza,inj,Quad PF,6+ Mos 02/10/2019  . Influenza-Unspecified 01/10/2013, 02/10/2018  . Tdap 12/10/2012   Colonoscopy: routine screening  Mammogram: routine screening  Pap smear: followed by dr. 02/09/2013, utd.    Review of Systems  Constitutional: Positive for fatigue. Negative for chills and fever.  HENT: Negative for dental problem, ear pain, hearing loss and trouble swallowing.   Eyes: Negative for visual disturbance.  Respiratory: Negative for cough, chest tightness and shortness of breath.   Cardiovascular: Negative for chest pain, palpitations and leg swelling.  Gastrointestinal: Negative for abdominal pain, blood in stool, diarrhea and nausea.  Endocrine: Negative for cold intolerance, polydipsia, polyphagia and polyuria.  Genitourinary: Negative for dysuria, frequency, hematuria and pelvic pain.  Musculoskeletal: Negative for arthralgias and back pain.  Skin: Negative for rash.  Neurological: Negative for dizziness and headaches.  Psychiatric/Behavioral: Negative for dysphoric mood and sleep disturbance. The patient is not nervous/anxious.     Allergies Patient has No Known  Allergies.  Past Medical History Patient  has a past medical history of Anxiety, Chicken pox, and Hypertension.  Surgical History Patient  has a past surgical history that includes Tonsillectomy (2009) and Nose surgery.  Family History Pateint's family history includes Anxiety disorder in her mother and sister; Arthritis in her paternal grandmother; COPD in her maternal grandfather; Cancer in her paternal grandmother; Depression in her father and paternal grandmother; Heart disease in her paternal grandfather; High blood pressure in her paternal grandfather; Lung cancer in her maternal grandmother.  Social History Patient  reports that she has never smoked. She has never used smokeless tobacco. She reports that she does not drink alcohol and does not use drugs.    Objective: Vitals:   09/14/20 0918  BP: 112/74  Pulse: 70  Temp: 97.6 F (36.4 C)  TempSrc: Temporal  SpO2: 98%  Weight: 213 lb 3.2 oz (96.7 kg)  Height: 5' 6.5" (1.689 m)    Body mass index is 33.9 kg/m.  Physical Exam Vitals reviewed.  Constitutional:      Appearance: Normal appearance. She is well-developed. She is obese.  HENT:     Head: Normocephalic and atraumatic.     Right Ear: Tympanic membrane, ear canal and external ear normal.     Left Ear: Tympanic membrane, ear canal and external ear normal.     Ears:     Comments: Scarring of right tm    Nose: Nose normal.     Mouth/Throat:     Mouth: Mucous membranes are moist.  Eyes:     Extraocular Movements: Extraocular movements intact.     Conjunctiva/sclera: Conjunctivae normal.     Pupils: Pupils  are equal, round, and reactive to light.  Neck:     Thyroid: No thyromegaly.     Vascular: No carotid bruit.  Cardiovascular:     Rate and Rhythm: Normal rate and regular rhythm.     Pulses: Normal pulses.     Heart sounds: Normal heart sounds. No murmur heard.   Pulmonary:     Effort: Pulmonary effort is normal.     Breath sounds: Normal breath  sounds.  Abdominal:     General: Bowel sounds are normal. There is no distension.     Palpations: Abdomen is soft.     Tenderness: There is no abdominal tenderness.  Musculoskeletal:     Cervical back: Normal range of motion and neck supple.  Lymphadenopathy:     Cervical: No cervical adenopathy.  Skin:    General: Skin is warm and dry.     Capillary Refill: Capillary refill takes less than 2 seconds.     Findings: No rash.  Neurological:     General: No focal deficit present.     Mental Status: She is alert and oriented to person, place, and time.     Cranial Nerves: No cranial nerve deficit.     Coordination: Coordination normal.     Deep Tendon Reflexes: Reflexes normal.  Psychiatric:        Mood and Affect: Mood normal.        Behavior: Behavior normal.    GAD 7 : Generalized Anxiety Score 09/14/2020 03/05/2018 09/28/2017  Nervous, Anxious, on Edge 1 3 3   Control/stop worrying 1 3 1   Worry too much - different things 2 3 3   Trouble relaxing 2 2 3   Restless 1 2 0  Easily annoyed or irritable 2 3 3   Afraid - awful might happen 0 2 0  Total GAD 7 Score 9 18 13   Anxiety Difficulty Not difficult at all Somewhat difficult Not difficult at all        Assessment/plan: 1. Annual physical exam Routine fasting labs today. Hm reviewed and utd. Will need records from her pap smear, but she has had this recently. She is trying to work on exercise. Overall doing well, keep up the good work.  F/u in one year.  Patient counseling [x]    Nutrition: Stressed importance of moderation in sodium/caffeine intake, saturated fat and cholesterol, caloric balance, sufficient intake of fresh fruits, vegetables, fiber, calcium, iron, and 1 mg of folate supplement per day (for females capable of pregnancy).  [x]    Stressed the importance of regular exercise.   []    Substance Abuse: Discussed cessation/primary prevention of tobacco, alcohol, or other drug use; driving or other dangerous activities  under the influence; availability of treatment for abuse.   [x]    Injury prevention: Discussed safety belts, safety helmets, smoke detector, smoking near bedding or upholstery.   [x]    Sexuality: Discussed sexually transmitted diseases, partner selection, use of condoms, avoidance of unintended pregnancy  and contraceptive alternatives.  [x]    Dental health: Discussed importance of regular tooth brushing, flossing, and dental visits.  [x]    Health maintenance and immunizations reviewed. Please refer to Health maintenance section.    - CBC with Differential/Platelet - Comprehensive metabolic panel - Lipid panel - TSH  2. Generalized anxiety disorder GAD7 score of 9 with significant improvement from last time it was given. well controlled. She likes her medication and okay to take with pregnancy, has already seen her gyn. Knows she needs to exercise and is working on this.  Refills given.  - citalopram (CELEXA) 20 MG tablet; Take 1 tablet (20 mg total) by mouth daily. For anxiety.  Dispense: 90 tablet; Refill: 3  3. Encounter for hepatitis C screening test for low risk patient  - Hepatitis C antibody  4. [redacted] weeks gestation of pregnancy -continue PNV -already seen by her gyn with 8 week ultrasound -so happy for them! Yah!     Return in about 1 year (around 09/14/2021) for annual/GAD.     Orland Mustard, MD [facility]  09/14/2020

## 2020-09-17 LAB — HEPATITIS C ANTIBODY
Hepatitis C Ab: NONREACTIVE
SIGNAL TO CUT-OFF: 0 (ref <1.00)

## 2020-09-20 ENCOUNTER — Other Ambulatory Visit (HOSPITAL_COMMUNITY): Payer: Self-pay

## 2020-10-04 LAB — OB RESULTS CONSOLE RUBELLA ANTIBODY, IGM: Rubella: IMMUNE

## 2020-10-04 LAB — OB RESULTS CONSOLE HEPATITIS B SURFACE ANTIGEN: Hepatitis B Surface Ag: NEGATIVE

## 2020-10-04 LAB — OB RESULTS CONSOLE HIV ANTIBODY (ROUTINE TESTING): HIV: NONREACTIVE

## 2020-12-04 LAB — OB RESULTS CONSOLE GC/CHLAMYDIA
Chlamydia: NEGATIVE
Gonorrhea: NEGATIVE

## 2021-01-01 ENCOUNTER — Other Ambulatory Visit (HOSPITAL_COMMUNITY): Payer: Self-pay

## 2021-04-01 ENCOUNTER — Other Ambulatory Visit (HOSPITAL_COMMUNITY): Payer: Self-pay

## 2021-04-16 ENCOUNTER — Other Ambulatory Visit: Payer: Self-pay

## 2021-04-16 ENCOUNTER — Inpatient Hospital Stay (HOSPITAL_COMMUNITY)
Admission: AD | Admit: 2021-04-16 | Discharge: 2021-04-18 | DRG: 806 | Disposition: A | Discharge status: Home / Self Care | Payer: No Typology Code available for payment source | Attending: Obstetrics and Gynecology | Admitting: Obstetrics and Gynecology

## 2021-04-16 ENCOUNTER — Encounter (HOSPITAL_COMMUNITY): Payer: Self-pay | Admitting: Obstetrics

## 2021-04-16 DIAGNOSIS — O99344 Other mental disorders complicating childbirth: Secondary | ICD-10-CM | POA: Diagnosis present

## 2021-04-16 DIAGNOSIS — Z20822 Contact with and (suspected) exposure to covid-19: Secondary | ICD-10-CM | POA: Diagnosis present

## 2021-04-16 DIAGNOSIS — G932 Benign intracranial hypertension: Secondary | ICD-10-CM | POA: Diagnosis present

## 2021-04-16 DIAGNOSIS — O99354 Diseases of the nervous system complicating childbirth: Secondary | ICD-10-CM | POA: Diagnosis present

## 2021-04-16 DIAGNOSIS — Z3A38 38 weeks gestation of pregnancy: Secondary | ICD-10-CM

## 2021-04-16 DIAGNOSIS — O99214 Obesity complicating childbirth: Secondary | ICD-10-CM | POA: Diagnosis present

## 2021-04-16 DIAGNOSIS — F411 Generalized anxiety disorder: Secondary | ICD-10-CM | POA: Diagnosis present

## 2021-04-16 DIAGNOSIS — E669 Obesity, unspecified: Secondary | ICD-10-CM | POA: Diagnosis present

## 2021-04-16 DIAGNOSIS — O26893 Other specified pregnancy related conditions, third trimester: Secondary | ICD-10-CM | POA: Diagnosis present

## 2021-04-16 HISTORY — DX: Nausea with vomiting, unspecified: R11.2

## 2021-04-16 HISTORY — DX: Other specified postprocedural states: Z98.890

## 2021-04-16 LAB — CBC
HCT: 35.5 % — ABNORMAL LOW (ref 36.0–46.0)
Hemoglobin: 11.7 g/dL — ABNORMAL LOW (ref 12.0–15.0)
MCH: 27.9 pg (ref 26.0–34.0)
MCHC: 33 g/dL (ref 30.0–36.0)
MCV: 84.7 fL (ref 80.0–100.0)
Platelets: 301 10*3/uL (ref 150–400)
RBC: 4.19 MIL/uL (ref 3.87–5.11)
RDW: 13.9 % (ref 11.5–15.5)
WBC: 16.5 10*3/uL — ABNORMAL HIGH (ref 4.0–10.5)
nRBC: 0 % (ref 0.0–0.2)

## 2021-04-16 LAB — TYPE AND SCREEN
ABO/RH(D): A POS
Antibody Screen: NEGATIVE

## 2021-04-16 LAB — RESP PANEL BY RT-PCR (FLU A&B, COVID) ARPGX2
Influenza A by PCR: NEGATIVE
Influenza B by PCR: NEGATIVE
SARS Coronavirus 2 by RT PCR: NEGATIVE

## 2021-04-16 MED ORDER — OXYTOCIN 10 UNIT/ML IJ SOLN: 10.0000 [IU] | Freq: Once | INTRAMUSCULAR | Status: AC

## 2021-04-16 MED ORDER — SOD CITRATE-CITRIC ACID 500-334 MG/5ML PO SOLN: 30.0000 mL | ORAL | Status: DC | PRN

## 2021-04-16 MED ORDER — LACTATED RINGERS IV SOLN: 500.0000 mL | INTRAVENOUS | Status: DC | PRN

## 2021-04-16 MED ORDER — OXYCODONE-ACETAMINOPHEN 5-325 MG PO TABS: 2.0000 | ORAL_TABLET | ORAL | Status: DC | PRN

## 2021-04-16 MED ORDER — LACTATED RINGERS IV SOLN: INTRAVENOUS | Status: DC

## 2021-04-16 MED ORDER — LIDOCAINE HCL (PF) 1 % IJ SOLN: 30.0000 mL | INTRAMUSCULAR | Status: AC | PRN

## 2021-04-16 MED ORDER — OXYTOCIN BOLUS FROM INFUSION
333.0000 mL | Freq: Once | INTRAVENOUS | Status: AC
  Administered 2021-04-16: 333 mL via INTRAVENOUS

## 2021-04-16 MED ORDER — ONDANSETRON HCL 4 MG/2ML IJ SOLN: 4.0000 mg | Freq: Four times a day (QID) | INTRAMUSCULAR | Status: DC | PRN

## 2021-04-16 MED ORDER — OXYCODONE-ACETAMINOPHEN 5-325 MG PO TABS: 1.0000 | ORAL_TABLET | ORAL | Status: DC | PRN

## 2021-04-16 MED ORDER — OXYTOCIN-SODIUM CHLORIDE 30-0.9 UT/500ML-% IV SOLN
2.5000 [IU]/h | INTRAVENOUS | Status: DC
  Filled 2021-04-16: qty 500

## 2021-04-16 MED ORDER — FLEET ENEMA 7-19 GM/118ML RE ENEM: 1.0000 | ENEMA | RECTAL | Status: DC | PRN

## 2021-04-16 MED ORDER — ACETAMINOPHEN 325 MG PO TABS: 650.0000 mg | ORAL_TABLET | ORAL | Status: DC | PRN

## 2021-04-16 MED ORDER — OXYTOCIN 10 UNIT/ML IJ SOLN
INTRAMUSCULAR | Status: AC
  Administered 2021-04-16: 10 [IU] via INTRAMUSCULAR
  Filled 2021-04-16: qty 1

## 2021-04-16 MED ORDER — LIDOCAINE HCL (PF) 1 % IJ SOLN
INTRAMUSCULAR | Status: AC
  Administered 2021-04-16: 30 mL via SUBCUTANEOUS
  Filled 2021-04-16: qty 30

## 2021-04-16 NOTE — H&P (Signed)
Susan Craig is a 30 y.o. female presenting for SROM and labor. OB History     Gravida  2   Para  1   Term  1   Preterm  0   AB  0   Living  1      SAB  0   IAB  0   Ectopic  0   Multiple  0   Live Births  1          Past Medical History:  Diagnosis Date   Anxiety    Chicken pox    Hypertension    Past Surgical History:  Procedure Laterality Date   NOSE SURGERY     septoplasty x2 and revision x1--- [facility]   TONSILLECTOMY  2009   Family History: family history includes Anxiety disorder in her mother and sister; Arthritis in her paternal grandmother; COPD in her maternal grandfather; Cancer in her paternal grandmother; Depression in her father and paternal grandmother; Heart disease in her paternal grandfather; High blood pressure in her paternal grandfather; Lung cancer in her maternal grandmother. Social History:  reports that she has never smoked. She has never used smokeless tobacco. She reports that she does not drink alcohol and does not use drugs.     Maternal Diabetes: No Genetic Screening: Normal Maternal Ultrasounds/Referrals: Normal Fetal Ultrasounds or other Referrals:  None Maternal Substance Abuse:  No Significant Maternal Medications:  None Significant Maternal Lab Results:  Group B Strep negative Other Comments:   ICH no meds  Review of Systems  Constitutional: Negative.   All other systems reviewed and are negative. Maternal Medical History:  Reason for admission: Rupture of membranes.   Contractions: Onset was 1-2 hours ago.   Frequency: regular.   Perceived severity is moderate.   Fetal activity: Perceived fetal activity is normal.   Last perceived fetal movement was within the past hour.   Prenatal complications: Polyhydramnios.   Prenatal Complications - Diabetes: none.  Dilation: 6.5 Effacement (%): 80 Station: -2 Exam by:: Quintella Baton, RNC Blood pressure 125/76, pulse 85, last menstrual period 07/19/2020, SpO2 96 %,  unknown if currently breastfeeding. Maternal Exam:  Uterine Assessment: Contraction strength is moderate.  Contraction frequency is regular.  Abdomen: Patient reports no abdominal tenderness. Fetal presentation: vertex Introitus: Normal vulva. Normal vagina.  Ferning test: positive.  Nitrazine test: positive. Amniotic fluid character: clear. Pelvis: adequate for delivery.   Cervix: Cervix evaluated by digital exam.    Physical Exam Constitutional:      Appearance: Normal appearance.  HENT:     Head: Normocephalic and atraumatic.  Cardiovascular:     Rate and Rhythm: Normal rate and regular rhythm.     Pulses: Normal pulses.     Heart sounds: Normal heart sounds.  Pulmonary:     Effort: Pulmonary effort is normal.     Breath sounds: Normal breath sounds.  Abdominal:     General: Abdomen is flat.     Palpations: Abdomen is soft.  Genitourinary:    General: Normal vulva.  Musculoskeletal:        General: Normal range of motion.     Cervical back: Normal range of motion and neck supple.  Skin:    General: Skin is warm.  Neurological:     General: No focal deficit present.     Mental Status: She is alert and oriented to person, place, and time.  Psychiatric:        Mood and Affect: Mood normal.  Behavior: Behavior normal.    Prenatal labs: ABO, Rh: --/--/A POS (12/06 2145) Antibody: NEG (12/06 2145) Rubella:  imm RPR:   neg HBsAg:   neg HIV:   neg GBS:   neg  Assessment/Plan: SROM at term ICH no meds   Melesio Madara J 04/16/2021, 10:56 PM

## 2021-04-16 NOTE — Lactation Note (Signed)
Lactation Consultation Note Mom had the baby as soon as she came into L&D.  Baby fussy laying between mom's chest. Face appears bruised. Assisted in cradle position to Lt. Nipple flat. LC could t-cup nipple and hold in baby's mouth but if let go baby would let go. Changed positions to Rt. Nipple. Rt. Nipple has slight everted ridge w/Skin tags. Mom stated her nipple wasn't that way until she became pregnant.  Noted large breast having hardened areas which LC feels could be colostrum. Breast feels like they are filling. Will f/u mom on MBU.  Patient Name: Susan Craig ASNKN'L Date: 04/16/2021 Reason for consult: L&D Initial assessment Age:30 y.o.  Maternal Data Has patient been taught Hand Expression?: Yes Does the patient have breastfeeding experience prior to this delivery?: Yes How long did the patient breastfeed?: attempted to BF for a couple of week then pumped for 9 months  Feeding    LATCH Score Latch: Repeated attempts needed to sustain latch, nipple held in mouth throughout feeding, stimulation needed to elicit sucking reflex.  Audible Swallowing: None  Type of Nipple: Flat (Lt. flat/Rt. everted skin tags or pieces of nipple)  Comfort (Breast/Nipple): Filling, red/small blisters or bruises, mild/mod discomfort (breast are filling and has hardened areas.)  Hold (Positioning): Full assist, staff holds infant at breast  LATCH Score: 3   Lactation Tools Discussed/Used    Interventions Interventions: Breast feeding basics reviewed;Adjust position;Assisted with latch;Support pillows;Skin to skin;Position options;Breast massage;Hand express;Breast compression  Discharge WIC Program: No  Consult Status Consult Status: Follow-up Date: 04/17/21 Follow-up type: In-patient    Charyl Dancer 04/16/2021, 11:52 PM

## 2021-04-16 NOTE — MAU Note (Signed)
..  Susan Craig is a 30 y.o. at [redacted]w[redacted]d here in MAU reporting: A big gush of fluid since 8pm. GBS neg. +FM. Began contracting on the way here. Was 1cm on Thursday.

## 2021-04-17 ENCOUNTER — Encounter (HOSPITAL_COMMUNITY): Payer: Self-pay | Admitting: Obstetrics and Gynecology

## 2021-04-17 LAB — CBC
HCT: 31.8 % — ABNORMAL LOW (ref 36.0–46.0)
Hemoglobin: 10.3 g/dL — ABNORMAL LOW (ref 12.0–15.0)
MCH: 27.2 pg (ref 26.0–34.0)
MCHC: 32.4 g/dL (ref 30.0–36.0)
MCV: 83.9 fL (ref 80.0–100.0)
Platelets: 294 10*3/uL (ref 150–400)
RBC: 3.79 MIL/uL — ABNORMAL LOW (ref 3.87–5.11)
RDW: 14 % (ref 11.5–15.5)
WBC: 25 10*3/uL — ABNORMAL HIGH (ref 4.0–10.5)
nRBC: 0 % (ref 0.0–0.2)

## 2021-04-17 LAB — RPR: RPR Ser Ql: NONREACTIVE

## 2021-04-17 MED ORDER — CITALOPRAM HYDROBROMIDE 20 MG PO TABS
20.0000 mg | ORAL_TABLET | Freq: Every day | ORAL | Status: DC
  Administered 2021-04-17: 20 mg via ORAL
  Filled 2021-04-17 (×2): qty 1

## 2021-04-17 MED ORDER — ONDANSETRON HCL 4 MG PO TABS: 4.0000 mg | ORAL_TABLET | ORAL | Status: DC | PRN

## 2021-04-17 MED ORDER — BENZOCAINE-MENTHOL 20-0.5 % EX AERO
1.0000 "application " | INHALATION_SPRAY | CUTANEOUS | Status: DC | PRN
  Administered 2021-04-17 – 2021-04-18 (×2): 1 via TOPICAL
  Filled 2021-04-17 (×2): qty 56

## 2021-04-17 MED ORDER — DIBUCAINE (PERIANAL) 1 % EX OINT: 1.0000 "application " | TOPICAL_OINTMENT | CUTANEOUS | Status: DC | PRN

## 2021-04-17 MED ORDER — COCONUT OIL OIL: 1.0000 "application " | TOPICAL_OIL | Status: DC | PRN

## 2021-04-17 MED ORDER — ONDANSETRON HCL 4 MG/2ML IJ SOLN: 4.0000 mg | INTRAMUSCULAR | Status: DC | PRN

## 2021-04-17 MED ORDER — OXYCODONE-ACETAMINOPHEN 5-325 MG PO TABS: 2.0000 | ORAL_TABLET | ORAL | Status: DC | PRN

## 2021-04-17 MED ORDER — PRENATAL MULTIVITAMIN CH
1.0000 | ORAL_TABLET | Freq: Every day | ORAL | Status: DC
  Administered 2021-04-17: 1 via ORAL
  Filled 2021-04-17: qty 1

## 2021-04-17 MED ORDER — SIMETHICONE 80 MG PO CHEW: 80.0000 mg | CHEWABLE_TABLET | ORAL | Status: DC | PRN

## 2021-04-17 MED ORDER — TETANUS-DIPHTH-ACELL PERTUSSIS 5-2.5-18.5 LF-MCG/0.5 IM SUSY: 0.5000 mL | PREFILLED_SYRINGE | Freq: Once | INTRAMUSCULAR | Status: DC

## 2021-04-17 MED ORDER — ACETAMINOPHEN 325 MG PO TABS: 650.0000 mg | ORAL_TABLET | ORAL | Status: DC | PRN

## 2021-04-17 MED ORDER — ZOLPIDEM TARTRATE 5 MG PO TABS: 5.0000 mg | ORAL_TABLET | Freq: Every evening | ORAL | Status: DC | PRN

## 2021-04-17 MED ORDER — DIPHENHYDRAMINE HCL 25 MG PO CAPS: 25.0000 mg | ORAL_CAPSULE | Freq: Four times a day (QID) | ORAL | Status: DC | PRN

## 2021-04-17 MED ORDER — WITCH HAZEL-GLYCERIN EX PADS: 1.0000 "application " | MEDICATED_PAD | CUTANEOUS | Status: DC | PRN

## 2021-04-17 MED ORDER — METHYLERGONOVINE MALEATE 0.2 MG PO TABS: 0.2000 mg | ORAL_TABLET | ORAL | Status: DC | PRN

## 2021-04-17 MED ORDER — METHYLERGONOVINE MALEATE 0.2 MG/ML IJ SOLN: 0.2000 mg | INTRAMUSCULAR | Status: DC | PRN

## 2021-04-17 MED ORDER — SENNOSIDES-DOCUSATE SODIUM 8.6-50 MG PO TABS
2.0000 | ORAL_TABLET | Freq: Every day | ORAL | Status: DC
  Administered 2021-04-17 – 2021-04-18 (×2): 2 via ORAL
  Filled 2021-04-17 (×2): qty 2

## 2021-04-17 MED ORDER — IBUPROFEN 600 MG PO TABS
600.0000 mg | ORAL_TABLET | Freq: Four times a day (QID) | ORAL | Status: DC
  Administered 2021-04-17 – 2021-04-18 (×8): 600 mg via ORAL
  Filled 2021-04-17 (×8): qty 1

## 2021-04-17 MED ORDER — OXYCODONE-ACETAMINOPHEN 5-325 MG PO TABS: 1.0000 | ORAL_TABLET | ORAL | Status: DC | PRN

## 2021-04-17 NOTE — Lactation Note (Addendum)
This note was copied from a baby's chart. Lactation Consultation Note  Patient Name: Susan Craig Date: 04/17/2021 Reason for consult: Initial assessment;Difficult latch;Early term 37-38.6wks Age:30 hours  LC in to visit with P2 Mom of ET infant.  Baby cueing but unable to sustain a deep latch to breast.  Mom had difficult time with first baby (who is 32 yrs old) and stopped putting baby to the breast after a couple weeks.  LC tried using a tea cup hold and baby would suck onto areola with a deep latch for a few sucks before coming off the breast.  Repeatedly latched and unlatched.  Nice flow of colostrum on hand expression, baby given a couple drops on finger.   LC initiated a 20 mm nipple shield as baby was cueing so actively and Mom has a good colostrum flow.  Baby disorganized initially, but did settle into a good suck pattern.  Nipple pulled further into shield when baby came off.  An occasional swallow identified.  Mom assisted with supporting her breast firmly during feeding.  Rolled up cloth under breast for support.  LC set up DEBP with instructions to pump after breastfeeding to support her milk supply.  Curved tip syringe provided to "front load" nipple shield with drops of colostrum when feeding baby.   LC reviewed importance of disassembling pump parts, washing, rinsing and air drying in separate bin provided.  Mom encouraged to ask for help prn.  Encouraged STS with baby watching for cues and putting baby to breast.  Mom has a [employer] DEBP at home from her first baby, but will qualify for an employee pump on discharge.   Maternal Data Has patient been taught Hand Expression?: Yes Does the patient have breastfeeding experience prior to this delivery?: Yes How long did the patient breastfeed?: 8 months  Feeding Mother's Current Feeding Choice: Breast Milk  LATCH Score Latch: Repeated attempts needed to sustain latch, nipple held in mouth throughout feeding,  stimulation needed to elicit sucking reflex.  Audible Swallowing: A few with stimulation  Type of Nipple: Flat  Comfort (Breast/Nipple): Soft / non-tender  Hold (Positioning): Assistance needed to correctly position infant at breast and maintain latch.  LATCH Score: 6   Lactation Tools Discussed/Used Tools: Shells;Pump;Flanges;Nipple Shields Nipple shield size: 20 Flange Size: 24 Breast pump type: Double-Electric Breast Pump Pump Education: Setup, frequency, and cleaning;Milk Storage Reason for Pumping: Support milk supply/use of nipple shield/difficult latch to breast Pumping frequency: Encouraged to pump after breastfeeding  Interventions Interventions: Breast feeding basics reviewed;Assisted with latch;Skin to skin;Breast massage;Hand express;Pre-pump if needed;Breast compression;Adjust position;Support pillows;Position options;Shells;DEBP;LC Services brochure  Discharge Pump: Employee Pump (Mom to obtain her pump prior to discharge) WIC Program: No  Consult Status Consult Status: Follow-up Date: 04/18/21 Follow-up type: In-patient    Susan Craig 04/17/2021, 8:57 AM

## 2021-04-17 NOTE — Progress Notes (Signed)
PPD # 1 S/P NSVD  Live born female  Birth Weight: 8 lb 6 oz (3800 g) APGAR: 7, 9  Newborn Delivery   Birth date/time: 04/16/2021 22:26:00 Delivery type: Vaginal, Vacuum (Extractor)     Baby name: Susan Craig Delivering provider: Olivia Mackie  Episiotomy:Median   Feeding: breast  Pain control at delivery: Local   S:  Reports feeling well this morning with no complaints. Thrilled with birth experience.              Tolerating PO/No nausea or vomiting             Bleeding is light             Pain controlled with acetaminophen and ibuprofen (OTC)             Up ad lib/ambulatory/voiding without difficulties   O:  A & O x 3, in no apparent distress  Vitals:   04/17/21 0045 04/17/21 0146 04/17/21 0544 04/17/21 0914  BP: 108/74 101/68 117/84 107/73  Pulse: 100 95 85 85  Resp: 18 17 17 18   Temp: 98.3 F (36.8 C) 98.1 F (36.7 C) 97.7 F (36.5 C) 97.9 F (36.6 C)  TempSrc: Oral Oral Oral Oral  SpO2: 98% 99% 99% 96%  Weight:      Height:       Recent Labs    04/16/21 2145 04/17/21 0543  WBC 16.5* 25.0*  HGB 11.7* 10.3*  HCT 35.5* 31.8*  PLT 301 294    Blood type: --/--/A POS (12/06 2145)  Rubella: Immune (05/26 0000)   I&O: I/O last 3 completed shifts: In: -  Out: 100 [Blood:100]          No intake/output data recorded.  Vaccines: TDaP          UTD                    COVID-19 Declined   Gen: AAO x 3, NAD  Abdomen: soft, non-tender, non-distended            Fundus: firm, non-tender, U-1  Perineum: repair intact  Lochia: small  Extremities: no edema, no calf pain or tenderness   A/P:  PPD # 1 30 y.o., 10-04-1977  Principal Problem:   Postpartum care following vaginal delivery 12/6  Doing well - stable status  Routine post partum orders Active Problems:   Generalized anxiety disorder  Stable   Continue Celexa 20mg  daily   Obesity (BMI 30-39.9)   Pseudotumor cerebri   Normal labor and delivery   Vacuum-assisted vaginal delivery   Mediolateral episiotomy  with repair  Discussed perineal care and comfort measures.   Anticipate discharge tomorrow.   14/6, MSN, CNM 04/17/2021, 10:58 AM

## 2021-04-17 NOTE — Social Work (Signed)
MOB was referred for history of anxiety.  * Referral screened out by Clinical Social Worker because none of the following criteria appear to apply:  ~ History of anxiety/depression during this pregnancy, or of post-partum depression following prior delivery. ~ Diagnosis of anxiety and/or depression within last 3 years OR * MOB's symptoms currently being treated with medication and/or therapy Per chart review, MOB takes citalopram for anxiety symptoms.   Please contact the Clinical Social Worker if needs arise, by [facility] request, or if MOB scores greater than 9/yes to question 10 on Edinburgh Postpartum Depression Screen.   Vivi Barrack, MSW, LCSW [facility]  Clinical Social Worker  [phone] 04/17/2021  9:46 AM

## 2021-04-18 MED ORDER — ACETAMINOPHEN 500 MG PO TABS: 1000.0000 mg | ORAL_TABLET | Freq: Four times a day (QID) | ORAL | 2 refills | Status: DC | PRN

## 2021-04-18 MED ORDER — BENZOCAINE-MENTHOL 20-0.5 % EX AERO: 1.0000 "application " | INHALATION_SPRAY | CUTANEOUS | Status: DC | PRN

## 2021-04-18 MED ORDER — IBUPROFEN 600 MG PO TABS: 600.0000 mg | ORAL_TABLET | Freq: Four times a day (QID) | ORAL | 0 refills | Status: DC

## 2021-04-18 MED ORDER — COCONUT OIL OIL: 1.0000 "application " | TOPICAL_OIL | 0 refills | Status: DC | PRN

## 2021-04-18 NOTE — Discharge Instructions (Signed)
Lactation outpatient support - home visit ° ° °Jessica Bowers, IBCLC (lactation consultant)  & Birth Doula ° °Phone (text or call): [phone] °Email: [email or web address] °[email or web address] ° ° °Linda Coppola °RN, MHA, IBCLC °at [facility]: Lactation Consultant ° °[email or web address] °Mail: [email or web address] °Tel: [phone] ° ° °Additional breastfeeding resources: ° °[facility] °https://ibconline.ca/information-sheets/ ° °[employer] ° °www.lllofnc.org ° ° °Other Resources: ° °Chiropractic specialist  ° °Dr. Leanna Hastings °[email or web address] ° ° °Craniosacral therapy for baby ° °Erin Balkind  °[email or web address] ° °

## 2021-04-18 NOTE — Discharge Summary (Signed)
OB Discharge Summary  Patient Name: Susan Craig DOB: 31-Aug-1990 MRN: [id number]  Date of admission: 04/16/2021 Delivering provider: Olivia Mackie   Admitting diagnosis: Normal labor and delivery [O80] Intrauterine pregnancy: [redacted]w[redacted]d     Secondary diagnosis: Patient Active Problem List   Diagnosis Date Noted   Vacuum-assisted vaginal delivery 04/17/2021   Mediolateral episiotomy with repair 04/17/2021   Postpartum care following vaginal delivery 12/6 04/17/2021   Normal labor and delivery 04/16/2021   Pseudotumor cerebri 06/06/2015   Generalized anxiety disorder 08/08/2013   Obesity (BMI 30-39.9) 08/08/2013   Additional problems:none   Date of discharge: 04/18/2021   Discharge diagnosis: Principal Problem:   Postpartum care following vaginal delivery 12/6 Active Problems:   Generalized anxiety disorder   Obesity (BMI 30-39.9)   Pseudotumor cerebri   Normal labor and delivery   Vacuum-assisted vaginal delivery   Mediolateral episiotomy with repair                                                              Post partum procedures: none  Augmentation: N/A Pain control: Local  Laceration:  Episiotomy:Median  Complications: None  Hospital course:  Onset of Labor With Vaginal Delivery      30 y.o. yo [id number] at [redacted]w[redacted]d was admitted in Active Labor on 04/16/2021. Patient had an uncomplicated labor course as follows:  Membrane Rupture Time/Date: 8:00 PM ,04/16/2021   Delivery Method:Vaginal, Vacuum (Extractor)  Episiotomy: Median  Lacerations:    Patient had an uncomplicated postpartum course.  She is ambulating, tolerating a regular diet, passing flatus, and urinating well. Patient is discharged home in stable condition on 04/18/21.  Newborn Data: Birth date:04/16/2021  Birth time:10:26 PM  Gender:Female  Living status:Living  Apgars:7 ,9  Weight:3800 g   Physical exam  Vitals:   04/17/21 0914 04/17/21 1430 04/17/21 2153 04/18/21 0502  BP: 107/73 113/72 112/74  108/75  Pulse: 85 86 92 85  Resp: 18 18 16 17   Temp: 97.9 F (36.6 C) 97.8 F (36.6 C) 97.9 F (36.6 C) 97.9 F (36.6 C)  TempSrc: Oral Oral Oral Oral  SpO2: 96% 99% 100% 100%  Weight:      Height:       General: alert, cooperative, and no distress Lochia: appropriate Uterine Fundus: firm Incision: N/A Perineum: repair intact, no edema DVT Evaluation: No cords or calf tenderness. No significant calf/ankle edema. Labs: Lab Results  Component Value Date   WBC 25.0 (H) 04/17/2021   HGB 10.3 (L) 04/17/2021   HCT 31.8 (L) 04/17/2021   MCV 83.9 04/17/2021   PLT 294 04/17/2021   CMP Latest Ref Rng & Units 09/14/2020  Glucose 70 - 99 mg/dL 87  BUN 6 - 23 mg/dL 12  Creatinine 11/14/2020 - 7.67 mg/dL 2.09  Sodium 4.70 - 962 mEq/L 137  Potassium 3.5 - 5.1 mEq/L 4.1  Chloride 96 - 112 mEq/L 102  CO2 19 - 32 mEq/L 27  Calcium 8.4 - 10.5 mg/dL 9.7  Total Protein 6.0 - 8.3 g/dL 7.3  Total Bilirubin 0.2 - 1.2 mg/dL 0.5  Alkaline Phos 39 - 117 U/L 59  AST 0 - 37 U/L 15  ALT 0 - 35 U/L 15   Edinburgh Postnatal Depression Scale Screening Tool 04/17/2021  I have been able to laugh and see the  funny side of things. 0  I have looked forward with enjoyment to things. 0  I have blamed myself unnecessarily when things went wrong. 1  I have been anxious or worried for no good reason. 2  I have felt scared or panicky for no good reason. 0  Things have been getting on top of me. 1  I have been so unhappy that I have had difficulty sleeping. 0  I have felt sad or miserable. 0  I have been so unhappy that I have been crying. 0  The thought of harming myself has occurred to me. 0  Edinburgh Postnatal Depression Scale Total 4   Vaccines: TDaP          UTD        COVID-19   declined        Flu             UTD  Discharge instruction:  per After Visit Summary,  [facility] OB booklet and  "Understanding Mother & Baby Care" hospital booklet  After Visit Meds:  Allergies as of 04/18/2021   No Known  Allergies      Medication List     TAKE these medications    acetaminophen 500 MG tablet Commonly known as: TYLENOL Take 2 tablets (1,000 mg total) by mouth every 6 (six) hours as needed.   benzocaine-Menthol 20-0.5 % Aero Commonly known as: DERMOPLAST Apply 1 application topically as needed for irritation (perineal discomfort).   citalopram 20 MG tablet Commonly known as: CELEXA Take 1 tablet (20 mg total) by mouth daily. For anxiety.   coconut oil Oil Apply 1 application topically as needed.   ibuprofen 600 MG tablet Commonly known as: ADVIL Take 1 tablet (600 mg total) by mouth every 6 (six) hours.   olopatadine 0.1 % ophthalmic solution Commonly known as: Patanol Place 1 drop into both eyes 2 (two) times daily. For allergies.   prenatal multivitamin Tabs tablet Take 1 tablet by mouth daily at 12 noon.               Discharge Care Instructions  (From admission, onward)           Start     Ordered   04/18/21 0000  Discharge wound care:       Comments: Sitz baths 2 times /day with warm water x 1 week. May add herbals: 1 ounce dried comfrey leaf* 1 ounce calendula flowers 1 ounce lavender flowers  Supplies can be found online at [employer] sources at [employer], [employer]  1/2 ounce dried uva ursi leaves 1/2 ounce witch hazel blossoms (if you can find them) 1/2 ounce dried sage leaf 1/2 cup sea salt Directions: Bring 2 quarts of water to a boil. Turn off heat, and place 1 ounce (approximately 1 large handful) of the above mixed herbs (not the salt) into the pot. Steep, covered, for 30 minutes.  Strain the liquid well with a fine mesh strainer, and discard the herb material. Add 2 quarts of liquid to the tub, along with the 1/2 cup of salt. This medicinal liquid can also be made into compresses and peri-rinses.   04/18/21 1101            Diet: routine diet  Activity: Advance as tolerated. Pelvic rest for 6 weeks.    Postpartum contraception: TBA in office  Newborn Data: Live born female  Birth Weight: 8 lb 6 oz (3800 g) APGAR: 7, 9  Newborn Delivery  Birth date/time: 04/16/2021 22:26:00 Delivery type: Vaginal, Vacuum (Extractor)      named Rylee Baby Feeding: Breast Disposition:home with mother  Delivery Report:  Review the Delivery Report for details.    Follow up:  Follow-up Information     Olivia Mackie, MD Follow up in 6 week(s).   Specialty: Obstetrics and Gynecology Why: For Postpartum follow-up Contact information: [address] [place] [address] [phone]                   Signed: Neta Mends, CNM, MSN 04/18/2021, 11:01 AM

## 2021-04-18 NOTE — Lactation Note (Signed)
This note was copied from a baby's chart.     Lactation Consultation Note  Patient Name: Girl Adan Beal WCBJS'E Date: 04/18/2021 Reason for consult: Follow-up assessment;Early term 23-38.6wks Age:30 hours   P2 mother whose infant is now 53 hours old.  This is an ETI at 38+5 weeks.  Mother breast fed her first child for 8 months.  Mother was breast feeding using a NS when I arrived.  Offered to observe/assist as needed.  Baby was very sleepy.  Awakened baby and assisted to latch deeply.  Demonstrated gentle stimulation to keep her actively engaged with her feeding.  Mother demonstrated good breast support.  Mother will continue to use the NS as needed.    Employee pump provided.  Receipt and insurance card returned to family.  All paperwork placed in Jonna's mailbox.  Mother is hoping to be discharged today.  Father present.   Maternal Data    Feeding Mother's Current Feeding Choice: Breast Milk  LATCH Score Latch: Grasps breast easily, tongue down, lips flanged, rhythmical sucking.  Audible Swallowing: A few with stimulation  Type of Nipple: Everted at rest and after stimulation  Comfort (Breast/Nipple): Soft / non-tender  Hold (Positioning): Assistance needed to correctly position infant at breast and maintain latch.  LATCH Score: 8   Lactation Tools Discussed/Used    Interventions Interventions: Breast feeding basics reviewed;Assisted with latch;Skin to skin;Breast massage;Hand express;Breast compression;Expressed milk;Position options;Support pillows;Adjust position;Education  Discharge Discharge Education: Engorgement and breast care Pump: Personal;Employee Pump WIC Program: No  Consult Status Consult Status: Complete Date: 04/18/21 Follow-up type: Call as needed    Bracha Frankowski R Latron Ribas 04/18/2021, 9:18 AM

## 2021-04-22 ENCOUNTER — Other Ambulatory Visit (HOSPITAL_COMMUNITY): Payer: Self-pay

## 2021-04-22 MED ORDER — CEPHALEXIN 500 MG PO CAPS
ORAL_CAPSULE | ORAL | 0 refills | Status: DC
Start: 2021-04-22 — End: 2021-08-13
  Filled 2021-04-22: qty 14, 7d supply, fill #0

## 2021-04-30 ENCOUNTER — Other Ambulatory Visit (HOSPITAL_COMMUNITY): Payer: Self-pay

## 2021-04-30 ENCOUNTER — Telehealth (HOSPITAL_COMMUNITY): Payer: Self-pay | Admitting: *Deleted

## 2021-04-30 NOTE — Telephone Encounter (Signed)
Attempted hospital discharge follow-up call. Left message for patient to return RN call. Deforest Hoyles, RN, 04/30/21, [phone]

## 2021-05-29 LAB — HM PAP SMEAR: HM Pap smear: NEGATIVE

## 2021-07-01 ENCOUNTER — Other Ambulatory Visit (HOSPITAL_COMMUNITY): Payer: Self-pay

## 2021-07-23 ENCOUNTER — Other Ambulatory Visit (HOSPITAL_COMMUNITY): Payer: Self-pay

## 2021-07-23 MED ORDER — CEPHALEXIN 500 MG PO CAPS
ORAL_CAPSULE | ORAL | 0 refills | Status: DC
  Filled 2021-07-23: qty 40, 10d supply, fill #0

## 2021-08-13 ENCOUNTER — Telehealth: Payer: No Typology Code available for payment source | Admitting: Physician Assistant

## 2021-08-13 ENCOUNTER — Other Ambulatory Visit (HOSPITAL_COMMUNITY): Payer: Self-pay

## 2021-08-13 DIAGNOSIS — J208 Acute bronchitis due to other specified organisms: Secondary | ICD-10-CM | POA: Diagnosis not present

## 2021-08-13 DIAGNOSIS — B9689 Other specified bacterial agents as the cause of diseases classified elsewhere: Secondary | ICD-10-CM | POA: Diagnosis not present

## 2021-08-13 MED ORDER — AMOXICILLIN 875 MG PO TABS
875.0000 mg | ORAL_TABLET | Freq: Two times a day (BID) | ORAL | 0 refills | Status: AC
Start: 2021-08-13 — End: 2021-08-20
  Filled 2021-08-13: qty 14, 7d supply, fill #0

## 2021-08-13 NOTE — Progress Notes (Signed)
We are sorry that you are not feeling well.  Here is how we plan to help! ? ?Based on your presentation I believe you most likely have A cough due to bacteria.  When patients have a fever and a productive cough with a change in color or increased sputum production, we are concerned about bacterial bronchitis.  If left untreated it can progress to pneumonia.  If your symptoms do not improve with your treatment plan it is important that you contact your provider.  Since you are breastfeeding, I have sent in Amoxicillin for you to take twice daily as directed. ? You can use OTC Delsym for cough.  ? ?From your responses in the eVisit questionnaire you describe inflammation in the upper respiratory tract which is causing a significant cough.  This is commonly called Bronchitis and has four common causes:   ?Allergies ?Viral Infections ?Acid Reflux ?Bacterial Infection ?Allergies, viruses and acid reflux are treated by controlling symptoms or eliminating the cause. An example might be a cough caused by taking certain blood pressure medications. You stop the cough by changing the medication. Another example might be a cough caused by acid reflux. Controlling the reflux helps control the cough. ? ?USE OF BRONCHODILATOR ("RESCUE") INHALERS: ?There is a risk from using your bronchodilator too frequently.  The risk is that over-reliance on a medication which only relaxes the muscles surrounding the breathing tubes can reduce the effectiveness of medications prescribed to reduce swelling and congestion of the tubes themselves.  Although you feel brief relief from the bronchodilator inhaler, your asthma may actually be worsening with the tubes becoming more swollen and filled with mucus.  This can delay other crucial treatments, such as oral steroid medications. If you need to use a bronchodilator inhaler daily, several times per day, you should discuss this with your provider.  There are probably better treatments that could be  used to keep your asthma under control.  ?   ?HOME CARE ?Only take medications as instructed by your medical team. ?Complete the entire course of an antibiotic. ?Drink plenty of fluids and get plenty of rest. ?Avoid close contacts especially the very young and the elderly ?Cover your mouth if you cough or cough into your sleeve. ?Always remember to wash your hands ?A steam or ultrasonic humidifier can help congestion.  ? ?GET HELP RIGHT AWAY IF: ?You develop worsening fever. ?You become short of breath ?You cough up blood. ?Your symptoms persist after you have completed your treatment plan ?MAKE SURE YOU  ?Understand these instructions. ?Will watch your condition. ?Will get help right away if you are not doing well or get worse. ?  ? ?Thank you for choosing an e-visit. ? ?Your e-visit answers were reviewed by a board certified advanced clinical practitioner to complete your personal care plan. Depending upon the condition, your plan could have included both over the counter or prescription medications. ? ?Please review your pharmacy choice. Make sure the pharmacy is open so you can pick up prescription now. If there is a problem, you may contact your provider through [employer] and have the prescription routed to another pharmacy.  Your safety is important to [place]. If you have drug allergies check your prescription carefully.  ? ?For the next 24 hours you can use MyChart to ask questions about today's visit, request a non-urgent call back, or ask for a work or school excuse. ?You will get an email in the next two days asking about your experience. I hope that  your e-visit has been valuable and will speed your recovery. ? ?

## 2021-08-13 NOTE — Progress Notes (Signed)
I have spent 5 minutes in review of e-visit questionnaire, review and updating patient chart, medical decision making and response to patient.   Waymond Meador Cody Findley Blankenbaker, PA-C    

## 2021-08-25 ENCOUNTER — Telehealth: Payer: No Typology Code available for payment source | Admitting: Physician Assistant

## 2021-08-25 DIAGNOSIS — R058 Other specified cough: Secondary | ICD-10-CM

## 2021-08-25 MED ORDER — PREDNISONE 20 MG PO TABS: 20.0000 mg | ORAL_TABLET | Freq: Every day | ORAL | 0 refills | Status: DC

## 2021-08-25 NOTE — Progress Notes (Signed)
We are sorry that you are not feeling well.  Here is how we plan to help! ? ?Based on your presentation I believe you most likely have  post-viral cough syndrome. This is a chronic cough after a moderate upper respiratory infection that is caused from residual inflammation in the airway from the previous infection. ?  ?  ?In addition you may use A non-prescription cough medication called Mucinex DM: take 2 tablets every 12 hours. ? ?Prednisone 20 mg daily for 5 days ? ?From your responses in the eVisit questionnaire you describe inflammation in the upper respiratory tract which is causing a significant cough.  This is commonly called Bronchitis and has four common causes:   ?Allergies ?Viral Infections ?Acid Reflux ?Bacterial Infection ?Allergies, viruses and acid reflux are treated by controlling symptoms or eliminating the cause. An example might be a cough caused by taking certain blood pressure medications. You stop the cough by changing the medication. Another example might be a cough caused by acid reflux. Controlling the reflux helps control the cough. ? ?USE OF BRONCHODILATOR ("RESCUE") INHALERS: ?There is a risk from using your bronchodilator too frequently.  The risk is that over-reliance on a medication which only relaxes the muscles surrounding the breathing tubes can reduce the effectiveness of medications prescribed to reduce swelling and congestion of the tubes themselves.  Although you feel brief relief from the bronchodilator inhaler, your asthma may actually be worsening with the tubes becoming more swollen and filled with mucus.  This can delay other crucial treatments, such as oral steroid medications. If you need to use a bronchodilator inhaler daily, several times per day, you should discuss this with your provider.  There are probably better treatments that could be used to keep your asthma under control.  ?   ?HOME CARE ?Only take medications as instructed by your medical team. ?Complete the  entire course of an antibiotic. ?Drink plenty of fluids and get plenty of rest. ?Avoid close contacts especially the very young and the elderly ?Cover your mouth if you cough or cough into your sleeve. ?Always remember to wash your hands ?A steam or ultrasonic humidifier can help congestion.  ? ?GET HELP RIGHT AWAY IF: ?You develop worsening fever. ?You become short of breath ?You cough up blood. ?Your symptoms persist after you have completed your treatment plan ?MAKE SURE YOU  ?Understand these instructions. ?Will watch your condition. ?Will get help right away if you are not doing well or get worse. ?  ? ?Thank you for choosing an e-visit. ? ?Your e-visit answers were reviewed by a board certified advanced clinical practitioner to complete your personal care plan. Depending upon the condition, your plan could have included both over the counter or prescription medications. ? ?Please review your pharmacy choice. Make sure the pharmacy is open so you can pick up prescription now. If there is a problem, you may contact your provider through [employer] and have the prescription routed to another pharmacy.  Your safety is important to [place]. If you have drug allergies check your prescription carefully.  ? ?For the next 24 hours you can use MyChart to ask questions about today's visit, request a non-urgent call back, or ask for a work or school excuse. ?You will get an email in the next two days asking about your experience. I hope that your e-visit has been valuable and will speed your recovery. ? ? ?I provided 5 minutes of non face-to-face time during this encounter for chart review and  documentation.  ? ?

## 2021-09-30 ENCOUNTER — Other Ambulatory Visit: Payer: Self-pay | Admitting: Family Medicine

## 2021-09-30 DIAGNOSIS — F411 Generalized anxiety disorder: Secondary | ICD-10-CM

## 2021-10-01 ENCOUNTER — Other Ambulatory Visit (HOSPITAL_COMMUNITY): Payer: Self-pay

## 2021-10-01 ENCOUNTER — Other Ambulatory Visit: Payer: Self-pay | Admitting: Family

## 2021-10-01 ENCOUNTER — Telehealth: Payer: Self-pay

## 2021-10-01 MED ORDER — CITALOPRAM HYDROBROMIDE 20 MG PO TABS
20.0000 mg | ORAL_TABLET | Freq: Every day | ORAL | 0 refills | Status: DC
  Filled 2021-10-01: qty 30, 30d supply, fill #0

## 2021-10-01 NOTE — Telephone Encounter (Signed)
Patient is requesting TOC from [facility] to [place]?  Patient called in requesting TOC from [facility].  I have scheduled TOC based off of last CPE scheduled with Artis Flock before she left instead of looking at PCP in chart.    Please advise.

## 2021-10-01 NOTE — Telephone Encounter (Signed)
Patient has scheduled a TOC to [place].  She is requesting refill on medication to be sent to pharmacy today.   Please give patient a call in regard as Susan Craig is out of the office today and tomorrow.  Patient can be reached at [phone].

## 2021-10-01 NOTE — Telephone Encounter (Signed)
Fine with me, thanks. 

## 2021-10-04 NOTE — Telephone Encounter (Signed)
Patient is scheduled.    Dulce Sellar sent script for Celexa to patients pharmacy.  Patient states Dr. Artis Flock has refilled this script for her and she no longer needs script sent in by [facility].

## 2021-10-04 NOTE — Telephone Encounter (Signed)
Noted  

## 2021-10-31 ENCOUNTER — Other Ambulatory Visit (HOSPITAL_COMMUNITY): Payer: Self-pay

## 2021-10-31 ENCOUNTER — Encounter: Payer: Self-pay | Admitting: Physician Assistant

## 2021-10-31 ENCOUNTER — Ambulatory Visit (INDEPENDENT_AMBULATORY_CARE_PROVIDER_SITE_OTHER): Payer: No Typology Code available for payment source | Admitting: Physician Assistant

## 2021-10-31 VITALS — BP 102/68 | HR 80 | Temp 98.1°F | Ht 65.0 in | Wt 218.0 lb

## 2021-10-31 DIAGNOSIS — E669 Obesity, unspecified: Secondary | ICD-10-CM

## 2021-10-31 DIAGNOSIS — F411 Generalized anxiety disorder: Secondary | ICD-10-CM

## 2021-10-31 DIAGNOSIS — E785 Hyperlipidemia, unspecified: Secondary | ICD-10-CM | POA: Diagnosis not present

## 2021-10-31 DIAGNOSIS — Z0000 Encounter for general adult medical examination without abnormal findings: Secondary | ICD-10-CM | POA: Diagnosis not present

## 2021-10-31 LAB — COMPREHENSIVE METABOLIC PANEL
ALT: 15 U/L (ref 0–35)
AST: 16 U/L (ref 0–37)
Albumin: 4.6 g/dL (ref 3.5–5.2)
Alkaline Phosphatase: 104 U/L (ref 39–117)
BUN: 25 mg/dL — ABNORMAL HIGH (ref 6–23)
CO2: 28 mEq/L (ref 19–32)
Calcium: 9.7 mg/dL (ref 8.4–10.5)
Chloride: 104 mEq/L (ref 96–112)
Creatinine, Ser: 0.84 mg/dL (ref 0.40–1.20)
GFR: 92.57 mL/min (ref >60.00)
Glucose, Bld: 86 mg/dL (ref 70–99)
Potassium: 4.1 mEq/L (ref 3.5–5.1)
Sodium: 139 mEq/L (ref 135–145)
Total Bilirubin: 0.4 mg/dL (ref 0.2–1.2)
Total Protein: 7.8 g/dL (ref 6.0–8.3)

## 2021-10-31 LAB — LIPID PANEL
Cholesterol: 216 mg/dL — ABNORMAL HIGH (ref 0–200)
HDL: 70.1 mg/dL (ref >39.00)
LDL Cholesterol: 134 mg/dL — ABNORMAL HIGH (ref 0–99)
NonHDL: 145.71
Total CHOL/HDL Ratio: 3
Triglycerides: 61 mg/dL (ref 0.0–149.0)
VLDL: 12.2 mg/dL (ref 0.0–40.0)

## 2021-10-31 LAB — CBC WITH DIFFERENTIAL/PLATELET
Basophils Absolute: 0 10*3/uL (ref 0.0–0.1)
Basophils Relative: 0.5 % (ref 0.0–3.0)
Eosinophils Absolute: 0.1 10*3/uL (ref 0.0–0.7)
Eosinophils Relative: 1.5 % (ref 0.0–5.0)
HCT: 41.7 % (ref 36.0–46.0)
Hemoglobin: 13.7 g/dL (ref 12.0–15.0)
Lymphocytes Relative: 29 % (ref 12.0–46.0)
Lymphs Abs: 2.3 10*3/uL (ref 0.7–4.0)
MCHC: 32.9 g/dL (ref 30.0–36.0)
MCV: 89.7 fl (ref 78.0–100.0)
Monocytes Absolute: 0.5 10*3/uL (ref 0.1–1.0)
Monocytes Relative: 6 % (ref 3.0–12.0)
Neutro Abs: 5 10*3/uL (ref 1.4–7.7)
Neutrophils Relative %: 63 % (ref 43.0–77.0)
Platelets: 334 10*3/uL (ref 150.0–400.0)
RBC: 4.65 Mil/uL (ref 3.87–5.11)
RDW: 14.5 % (ref 11.5–15.5)
WBC: 7.9 10*3/uL (ref 4.0–10.5)

## 2021-10-31 LAB — TSH: TSH: 1.33 u[IU]/mL (ref 0.35–5.50)

## 2021-10-31 MED ORDER — CITALOPRAM HYDROBROMIDE 20 MG PO TABS
20.0000 mg | ORAL_TABLET | Freq: Every day | ORAL | 3 refills | Status: DC
  Filled 2021-10-31: qty 90, 90d supply, fill #0
  Filled 2022-01-28: qty 40, 40d supply, fill #1
  Filled 2022-01-29: qty 50, 50d supply, fill #1
  Filled 2022-04-28: qty 90, 90d supply, fill #2
  Filled 2022-07-23: qty 90, 90d supply, fill #3

## 2021-10-31 NOTE — Patient Instructions (Addendum)
It was great to see you!  Follow-up after breastfeeding if you want to discuss weight loss!  Please go to the lab for blood work.   Our office will call you with your results unless you have chosen to receive results via MyChart.  If your blood work is normal we will follow-up each year for physicals and as scheduled for chronic medical problems.  If anything is abnormal we will treat accordingly and get you in for a follow-up.  Take care,  Lelon Mast

## 2021-10-31 NOTE — Progress Notes (Signed)
Subjective:    Susan Craig is a 31 y.o. female and is here for a comprehensive physical exam.  HPI  Health Maintenance Due  Topic Date Due   PAP SMEAR-Modifier  09/11/2017    Acute Concerns: None  Chronic Issues: Anxiety/Depression Patient is currently taking Celexa 20 mg daily with no complications. Tolerating her medication well with no adverse side effects. States she was taking Zoloft in the past but this was switched to Celexa due to not working well for her. She has found Celexa beneficial for her and has been doing well with this. Denies any worsening sx.   Health Maintenance: Immunizations -- UTD PAP -- UTD Bone Density -- N/A Diet -- Balanced Sleep habits -- No concern Exercise -- None  Alcohol- occasionally  Current Weight -- 218 Ib (98.9 kg) Weight History: Wt Readings from Last 10 Encounters:  10/31/21 218 lb (98.9 kg)  04/16/21 240 lb (108.9 kg)  09/14/20 213 lb 3.2 oz (96.7 kg)  06/10/19 182 lb 8 oz (82.8 kg)  03/05/18 205 lb (93 kg)  10/22/17 198 lb (89.8 kg)  09/28/17 204 lb 12 oz (92.9 kg)  10/01/16 183 lb 12.8 oz (83.4 kg)  06/19/16 174 lb 9.6 oz (79.2 kg)  02/12/16 184 lb (83.5 kg)   Body mass index is 36.28 kg/m. Mood -- Better  Patient's last menstrual period was 10/25/2021. Period characteristics -- regular sometimes heavier.  Birth control -- None     reports that she does not currently use alcohol.  Tobacco Use: Low Risk  (10/31/2021)   Patient History    Smoking Tobacco Use: Never    Smokeless Tobacco Use: Never    Passive Exposure: Not on file        10/31/2021   10:04 AM  Depression screen PHQ 2/9  Decreased Interest 0  Down, Depressed, Hopeless 0  PHQ - 2 Score 0     Other providers/specialists: Patient Care Team: Doreene Nest, NP as PCP - General (Internal Medicine) Olivia Mackie, MD as Consulting Physician (Obstetrics and Gynecology) Dimitri Ped, MD as Consulting Physician (Surgery)    PMHx, SurgHx, SocialHx, Medications, and Allergies were reviewed in the Visit Navigator and updated as appropriate.   Past Medical History:  Diagnosis Date   Anxiety    Chicken pox    PONV (postoperative nausea and vomiting)      Past Surgical History:  Procedure Laterality Date   NOSE SURGERY     sept[facility] x2 and [facility] x1--- [facility]   TONSILLECTOMY  2009     Family History  Problem Relation Age of Onset   Arthritis Paternal Grandmother    Depression Paternal Grandmother    Cancer Paternal Grandmother        pancreatic   Lung cancer Maternal Grandmother        Smoker   High blood pressure Paternal Grandfather    Heart disease Paternal Grandfather        a fib-- cabg   Depression Father    Anxiety disorder Mother    Anxiety disorder Sister    COPD Maternal Grandfather     Social History   Tobacco Use   Smoking status: Never   Smokeless tobacco: Never  Substance Use Topics   Alcohol use: Not Currently    Comment: Occasionally   Drug use: No    Review of Systems:   Review of Systems  Constitutional:  Negative for chills, fever, malaise/fatigue and weight loss.  HENT:  Negative  for hearing loss, sinus pain and sore throat.   Respiratory:  Negative for cough and hemoptysis.   Cardiovascular:  Negative for chest pain, palpitations, leg swelling and PND.  Gastrointestinal:  Negative for abdominal pain, constipation, diarrhea, heartburn, nausea and vomiting.  Genitourinary:  Negative for dysuria, frequency and urgency.  Musculoskeletal:  Negative for back pain, myalgias and neck pain.  Skin:  Negative for itching and rash.  Neurological:  Negative for dizziness, tingling, seizures and headaches.  Endo/Heme/Allergies:  Negative for polydipsia.  Psychiatric/Behavioral:  Negative for depression. The patient is not nervous/anxious.      Objective:   BP 102/68 (BP Location: Right Arm, Patient Position: Sitting, Cuff Size: Large)   Pulse 80   Temp 98.1 F  (36.7 C) (Temporal)   Ht 5\' 5"  (1.651 m)   Wt 218 lb (98.9 kg)   LMP 10/25/2021   SpO2 98%   Breastfeeding Yes   BMI 36.28 kg/m   General Appearance:    Alert, cooperative, no distress, appears stated age  Head:    Normocephalic, without obvious abnormality, atraumatic  Eyes:    PERRL, conjunctiva/corneas clear, EOM's intact, fundi    benign, both eyes  Ears:    Normal TM's and external ear canals, both ears  Nose:   Nares normal, septum midline, mucosa normal, no drainage    or sinus tenderness  Throat:   Lips, mucosa, and tongue normal; teeth and gums normal  Neck:   Supple, symmetrical, trachea midline, no adenopathy;    thyroid:  no enlargement/tenderness/nodules; no carotid   bruit or JVD  Back:     Symmetric, no curvature, ROM normal, no CVA tenderness  Lungs:     Clear to auscultation bilaterally, respirations unlabored  Chest Wall:    No tenderness or deformity   Heart:    Regular rate and rhythm, S1 and S2 normal, no murmur, rub   or gallop  Breast Exam:    Deferred  Abdomen:     Soft, non-tender, bowel sounds active all four quadrants,    no masses, no organomegaly  Genitalia:    Deferred  Rectal:    Deferred  Extremities:   Extremities normal, atraumatic, no cyanosis or edema  Pulses:   2+ and symmetric all extremities  Skin:   Skin color, texture, turgor normal, no rashes or lesions  Lymph nodes:   Cervical, supraclavicular, and axillary nodes normal  Neurologic:   CNII-XII intact, normal strength, sensation and reflexes    throughout    Assessment/Plan:   Routine physical examination Today patient counseled on age appropriate routine health concerns for screening and prevention, each reviewed and up to date or declined. Immunizations reviewed and up to date or declined. Labs ordered and reviewed. Risk factors for depression reviewed and negative. Hearing function and visual acuity are intact. ADLs screened and addressed as needed. Functional ability and level of  safety reviewed and appropriate. Education, counseling and referrals performed based on assessed risks today. Patient provided with a copy of personalized plan for preventive services.  Obesity (BMI 30-39.9) Recommend healthy diet and exercise as able -- did not recommend active weight loss attempts while breastfeeding  Hyperlipidemia, unspecified hyperlipidemia type Update lipid panel and provide recommendations accordingly  Generalized anxiety disorder Well controlled Continue celexa 20 mg daily Follow-up in 1 year -- sooner if concerns   Patient Counseling:   [x]     Nutrition: Stressed importance of moderation in sodium/caffeine intake, saturated fat and cholesterol, caloric balance, sufficient intake of fresh  fruits, vegetables, fiber, calcium, iron, and 1 mg of folate supplement per day (for females capable of pregnancy).   [x]      Stressed the importance of regular exercise.    [x]     Substance Abuse: Discussed cessation/primary prevention of tobacco, alcohol, or other drug use; driving or other dangerous activities under the influence; availability of treatment for abuse.    [x]      Injury prevention: Discussed safety belts, safety helmets, smoke detector, smoking near bedding or upholstery.    [x]      Sexuality: Discussed sexually transmitted diseases, partner selection, use of condoms, avoidance of unintended pregnancy  and contraceptive alternatives.    [x]     Dental health: Discussed importance of regular tooth brushing, flossing, and dental visits.   [x]      Health maintenance and immunizations reviewed. Please refer to Health maintenance section.    I,Savera Zaman,acting as a Education administrator for [employer], PA.,have documented all relevant documentation on the behalf of Inda Coke, PA,as directed by  Inda Coke, PA while in the presence of Inda Coke, [place].   I, Inda Coke, [place], have reviewed all documentation for this visit. The documentation on  10/31/21 for the exam, diagnosis, procedures, and orders are all accurate and complete.  Inda Coke, PA-C [place]

## 2021-12-05 ENCOUNTER — Other Ambulatory Visit (HOSPITAL_COMMUNITY): Payer: Self-pay

## 2021-12-05 ENCOUNTER — Encounter: Payer: Self-pay | Admitting: Physician Assistant

## 2021-12-05 MED ORDER — WEGOVY 0.25 MG/0.5ML ~~LOC~~ SOAJ
0.2500 mg | SUBCUTANEOUS | 0 refills | Status: DC
  Filled 2021-12-05: qty 2, 28d supply, fill #0

## 2021-12-05 NOTE — Telephone Encounter (Signed)
Please see message. °

## 2021-12-05 NOTE — Addendum Note (Signed)
Addended by: Jimmye Norman on: 12/05/2021 03:20 PM   Modules accepted: Orders

## 2021-12-09 ENCOUNTER — Telehealth: Payer: Self-pay | Admitting: *Deleted

## 2021-12-09 NOTE — Telephone Encounter (Signed)
Received fax from [employer] PA needed for Wegovy 0.25 mg. Completed [employer] form and faxed to [phone].

## 2021-12-11 NOTE — Telephone Encounter (Signed)
Received fax from [employer] to answer a question. Completed and faxed back to [employer] at [phone].

## 2021-12-12 ENCOUNTER — Other Ambulatory Visit (HOSPITAL_COMMUNITY): Payer: Self-pay

## 2021-12-12 NOTE — Telephone Encounter (Signed)
Spoke to pt told her Reginal Lutes has been approved 12/11/2021 through 07/02/2022. Pt verbalized understanding.

## 2021-12-12 NOTE — Telephone Encounter (Signed)
Received fax from [employer], Reginal Lutes has been approved 12/11/2021 through 07/02/2022.   Called [employer] and spoke to Mehan, told her Reginal Lutes has been approved as stated above. Marchelle Folks verbalized understanding and said she already called pt and told her medication is on backorder. Told her okay.

## 2021-12-20 ENCOUNTER — Other Ambulatory Visit (HOSPITAL_COMMUNITY): Payer: Self-pay

## 2021-12-23 ENCOUNTER — Other Ambulatory Visit (HOSPITAL_COMMUNITY): Payer: Self-pay

## 2021-12-30 ENCOUNTER — Other Ambulatory Visit (HOSPITAL_COMMUNITY): Payer: Self-pay

## 2021-12-30 MED ORDER — ONDANSETRON 8 MG PO TBDP
ORAL_TABLET | ORAL | 0 refills | Status: DC
  Filled 2021-12-30: qty 30, 10d supply, fill #0

## 2022-01-01 ENCOUNTER — Other Ambulatory Visit (HOSPITAL_COMMUNITY): Payer: Self-pay

## 2022-01-01 ENCOUNTER — Other Ambulatory Visit: Payer: Self-pay | Admitting: Physician Assistant

## 2022-01-01 MED ORDER — WEGOVY 0.5 MG/0.5ML ~~LOC~~ SOAJ
0.5000 mg | SUBCUTANEOUS | 1 refills | Status: DC
Start: 2022-01-01 — End: 2022-01-21
  Filled 2022-01-01: qty 2, 28d supply, fill #0
  Filled 2022-01-20: qty 2, 28d supply, fill #1

## 2022-01-01 MED ORDER — WEGOVY 0.25 MG/0.5ML ~~LOC~~ SOAJ
0.5000 mg | SUBCUTANEOUS | 0 refills | Status: DC
  Filled 2022-01-01: qty 2, 28d supply, fill #0

## 2022-01-20 ENCOUNTER — Other Ambulatory Visit (HOSPITAL_COMMUNITY): Payer: Self-pay

## 2022-01-21 ENCOUNTER — Other Ambulatory Visit (HOSPITAL_COMMUNITY): Payer: Self-pay

## 2022-01-21 ENCOUNTER — Encounter: Payer: Self-pay | Admitting: Physician Assistant

## 2022-01-21 MED ORDER — WEGOVY 1 MG/0.5ML ~~LOC~~ SOAJ
1.0000 mg | SUBCUTANEOUS | 0 refills | Status: DC
  Filled 2022-01-21: qty 2, 28d supply, fill #0

## 2022-01-23 ENCOUNTER — Telehealth: Payer: No Typology Code available for payment source | Admitting: Physician Assistant

## 2022-01-23 ENCOUNTER — Other Ambulatory Visit (HOSPITAL_COMMUNITY): Payer: Self-pay

## 2022-01-23 DIAGNOSIS — B3731 Acute candidiasis of vulva and vagina: Secondary | ICD-10-CM

## 2022-01-23 MED ORDER — FLUCONAZOLE 150 MG PO TABS
150.0000 mg | ORAL_TABLET | Freq: Once | ORAL | 0 refills | Status: AC
  Filled 2022-01-23: qty 1, 1d supply, fill #0

## 2022-01-23 NOTE — Progress Notes (Signed)

## 2022-01-23 NOTE — Progress Notes (Signed)
I have spent 5 minutes in review of e-visit questionnaire, review and updating patient chart, medical decision making and response to patient.   Dajah Fischman Cody Jacque Byron, PA-C    

## 2022-01-24 ENCOUNTER — Other Ambulatory Visit (HOSPITAL_COMMUNITY): Payer: Self-pay

## 2022-01-29 ENCOUNTER — Other Ambulatory Visit (HOSPITAL_COMMUNITY): Payer: Self-pay

## 2022-01-31 ENCOUNTER — Other Ambulatory Visit (HOSPITAL_COMMUNITY): Payer: Self-pay

## 2022-02-03 ENCOUNTER — Encounter: Payer: Self-pay | Admitting: *Deleted

## 2022-02-14 ENCOUNTER — Other Ambulatory Visit: Payer: Self-pay | Admitting: Physician Assistant

## 2022-02-14 ENCOUNTER — Other Ambulatory Visit (HOSPITAL_COMMUNITY): Payer: Self-pay

## 2022-02-14 MED ORDER — WEGOVY 1.7 MG/0.75ML ~~LOC~~ SOAJ
1.7000 mg | SUBCUTANEOUS | 0 refills | Status: DC
  Filled 2022-02-14: qty 3, 28d supply, fill #0

## 2022-03-07 ENCOUNTER — Other Ambulatory Visit (HOSPITAL_COMMUNITY): Payer: Self-pay

## 2022-03-12 ENCOUNTER — Other Ambulatory Visit: Payer: Self-pay | Admitting: Physician Assistant

## 2022-03-12 ENCOUNTER — Other Ambulatory Visit (HOSPITAL_COMMUNITY): Payer: Self-pay

## 2022-03-12 MED ORDER — WEGOVY 2.4 MG/0.75ML ~~LOC~~ SOAJ
2.4000 mg | SUBCUTANEOUS | 0 refills | Status: DC
  Filled 2022-03-12: qty 3, 28d supply, fill #0

## 2022-03-13 ENCOUNTER — Other Ambulatory Visit (HOSPITAL_COMMUNITY): Payer: Self-pay

## 2022-03-18 ENCOUNTER — Other Ambulatory Visit (HOSPITAL_COMMUNITY): Payer: Self-pay

## 2022-04-15 ENCOUNTER — Other Ambulatory Visit: Payer: Self-pay | Admitting: Physician Assistant

## 2022-04-15 ENCOUNTER — Other Ambulatory Visit (HOSPITAL_COMMUNITY): Payer: Self-pay

## 2022-04-15 MED ORDER — WEGOVY 2.4 MG/0.75ML ~~LOC~~ SOAJ
2.4000 mg | SUBCUTANEOUS | 0 refills | Status: DC
  Filled 2022-04-15: qty 3, 28d supply, fill #0

## 2022-04-22 ENCOUNTER — Encounter: Payer: Self-pay | Admitting: Physician Assistant

## 2022-04-22 ENCOUNTER — Other Ambulatory Visit: Payer: Self-pay | Admitting: Physician Assistant

## 2022-04-22 DIAGNOSIS — R1011 Right upper quadrant pain: Secondary | ICD-10-CM

## 2022-04-24 ENCOUNTER — Encounter: Payer: Self-pay | Admitting: *Deleted

## 2022-04-24 ENCOUNTER — Other Ambulatory Visit: Payer: Self-pay

## 2022-04-24 NOTE — Addendum Note (Signed)
Addended by: Laddie Aquas A on: 04/24/2022 11:24 AM   Modules accepted: Orders

## 2022-04-25 ENCOUNTER — Other Ambulatory Visit: Payer: Self-pay

## 2022-04-25 ENCOUNTER — Encounter: Payer: Self-pay | Admitting: Physician Assistant

## 2022-04-25 ENCOUNTER — Other Ambulatory Visit (HOSPITAL_COMMUNITY): Payer: Self-pay

## 2022-04-25 ENCOUNTER — Ambulatory Visit (INDEPENDENT_AMBULATORY_CARE_PROVIDER_SITE_OTHER): Payer: No Typology Code available for payment source | Admitting: Physician Assistant

## 2022-04-25 VITALS — BP 110/70 | HR 79 | Temp 98.2°F | Ht 65.0 in | Wt 213.5 lb

## 2022-04-25 DIAGNOSIS — R1011 Right upper quadrant pain: Secondary | ICD-10-CM

## 2022-04-25 LAB — CBC WITH DIFFERENTIAL/PLATELET

## 2022-04-25 MED ORDER — MELOXICAM 15 MG PO TABS
15.0000 mg | ORAL_TABLET | Freq: Every day | ORAL | 0 refills | Status: DC
  Filled 2022-04-25: qty 30, 30d supply, fill #0

## 2022-04-25 NOTE — Progress Notes (Signed)
Susan Craig is a 31 y.o. female here for a follow up of a pre-existing problem.  History of Present Illness:   Chief Complaint  Patient presents with   c/o rib pain    Pt c/o right sided rib pain x 5-6 months.    HPI  Rib pain Patient is complaining of a constant dull aching pain on right side of rib occurring 5-6 months ago. She states that she did not do anything originally to injure area. It occurs randomly and is worsening.  Pain is not exaggerated with eating. She manages symptoms with stretching and icy hot with relief. Patient also takes ibuprofen, but not consistently with no relief. She denies constipation and diarrhea or recent cough exacerbating issue.  She did an "informal" ultrasound at work that did not show gallstones.  Past Medical History:  Diagnosis Date   Anxiety    Chicken pox    PONV (postoperative nausea and vomiting)      Social History   Tobacco Use   Smoking status: Never   Smokeless tobacco: Never  Substance Use Topics   Alcohol use: Not Currently    Comment: Occasionally   Drug use: No    Past Surgical History:  Procedure Laterality Date   NOSE SURGERY     se[facility]2 and [facility]1--- [facility]   TONSILLECTOMY  2009    Family History  Problem Relation Age of Onset   Anxiety disorder Mother    Depression Father    Anxiety disorder Sister    Lung cancer Maternal Grandmother        Smoker   COPD Maternal Grandfather    Arthritis Paternal Grandmother    Depression Paternal Grandmother    Cancer Paternal Grandmother        pancreatic   High blood pressure Paternal Grandfather    Heart disease Paternal Grandfather        a fib-- cabg   Colon cancer Neg Hx     No Known Allergies  Current Medications:   Current Outpatient Medications:    citalopram (CELEXA) 20 MG tablet, Take 1 tablet (20 mg total) by mouth daily for anxiety., Disp: 90 tablet, Rfl: 3   olopatadine (PATANOL) 0.1 % ophthalmic solution, Place 1 drop into both  eyes 2 (two) times daily. For allergies., Disp: 5 mL, Rfl: 11   ondansetron (ZOFRAN-ODT) 8 MG disintegrating tablet, Place 1 tablet  on the tongue and allow to dissolve every 8 hours as needed for nausea/vomiting, Disp: 30 tablet, Rfl: 0   Prenatal Vit-Fe Fumarate-FA (PRENATAL MULTIVITAMIN) TABS tablet, Take 1 tablet by mouth daily at 12 noon., Disp: , Rfl:    Semaglutide-Weight Management (WEGOVY) 2.4 MG/0.75ML SOAJ, Inject 2.4 mg into the skin once a week., Disp: 3 mL, Rfl: 0   Review of Systems:   Review of Systems  Gastrointestinal:  Negative for constipation and diarrhea.  Musculoskeletal:        (+) rib pain    Vitals:   Vitals:   04/25/22 1103  BP: 110/70  Pulse: 79  Temp: 98.2 F (36.8 C)  TempSrc: Temporal  SpO2: 98%  Weight: 213 lb 8 oz (96.8 kg)  Height: 5\' 5"  (1.651 m)     Body mass index is 35.53 kg/m.  Physical Exam:   Physical Exam Constitutional:      General: She is not in acute distress.    Appearance: Normal appearance. She is not ill-appearing.  HENT:     Head: Normocephalic and atraumatic.  Right Ear: External ear normal.     Left Ear: External ear normal.  Eyes:     Extraocular Movements: Extraocular movements intact.     Pupils: Pupils are equal, round, and reactive to light.  Cardiovascular:     Rate and Rhythm: Normal rate and regular rhythm.     Heart sounds: Normal heart sounds. No murmur heard.    No gallop.  Pulmonary:     Effort: Pulmonary effort is normal. No respiratory distress.     Breath sounds: Normal breath sounds. No wheezing or rales.  Abdominal:     General: Abdomen is flat.     Palpations: Abdomen is soft.     Tenderness: There is no abdominal tenderness. There is no right CVA tenderness or left CVA tenderness. Negative signs include Murphy's sign.  Musculoskeletal:     Comments: No TTP to right ribs  Skin:    General: Skin is warm and dry.  Neurological:     Mental Status: She is alert and oriented to person,  place, and time.  Psychiatric:        Judgment: Judgment normal.     Assessment and Plan:   RUQ pain No red flags Blood work pending Will obtain R rib xray Start mobic 15 mg daily x 1-2 weeks Will provide recommendations once results have returned Follow-up precautions advised  I,Verona Buck,acting as a scribe for [employer], PA.,have documented all relevant documentation on the behalf of Jarold Motto, PA,as directed by  Jarold Motto, PA while in the presence of Jarold Motto, [place].  I, Jarold Motto, [place], have reviewed all documentation for this visit. The documentation on 04/25/22 for the exam, diagnosis, procedures, and orders are all accurate and complete.  Jarold Motto, PA-C

## 2022-04-25 NOTE — Patient Instructions (Signed)
It was great to see you!  Start meloxicam 15 mg daily x 1-2 weeks to see if this helps your symptoms  An order for xray has been put in for you. To have this done, you can walk in at the [facility] location without a scheduled appointment.  The address is [address] It is across the street from [facility]. Lab and x-xray are located in the basement.   Hours of operation are M-F 8:30am to 5:00pm.  Please note that they are closed for lunch between 12:30 and 1:00pm.  I'll be in touch with results and recommended next steps!  Take care,  Jarold Motto PA-C

## 2022-04-26 LAB — COMPREHENSIVE METABOLIC PANEL
ALT: 11 IU/L (ref 0–32)
AST: 12 IU/L (ref 0–40)
Albumin/Globulin Ratio: 1.8 (ref 1.2–2.2)
Albumin: 4.6 g/dL (ref 3.9–4.9)
Alkaline Phosphatase: 60 IU/L (ref 44–121)
BUN/Creatinine Ratio: 22 (ref 9–23)
BUN: 18 mg/dL (ref 6–20)
Bilirubin Total: 0.3 mg/dL (ref 0.0–1.2)
CO2: 20 mmol/L (ref 20–29)
Calcium: 9.4 mg/dL (ref 8.7–10.2)
Chloride: 106 mmol/L (ref 96–106)
Creatinine, Ser: 0.83 mg/dL (ref 0.57–1.00)
Globulin, Total: 2.6 g/dL (ref 1.5–4.5)
Glucose: 83 mg/dL (ref 70–99)
Potassium: 4.3 mmol/L (ref 3.5–5.2)
Sodium: 138 mmol/L (ref 134–144)
Total Protein: 7.2 g/dL (ref 6.0–8.5)
eGFR: 97 mL/min/{1.73_m2} (ref >59)

## 2022-04-26 LAB — LIPASE: Lipase: 36 U/L (ref 14–72)

## 2022-04-26 LAB — CBC WITH DIFFERENTIAL/PLATELET
Basophils Absolute: 0 10*3/uL (ref 0.0–0.2)
Basos: 1 %
EOS (ABSOLUTE): 0.1 10*3/uL (ref 0.0–0.4)
Eos: 1 %
Hematocrit: 40 % (ref 34.0–46.6)
Hemoglobin: 13.6 g/dL (ref 11.1–15.9)
Immature Grans (Abs): 0 10*3/uL (ref 0.0–0.1)
Immature Granulocytes: 0 %
Lymphocytes Absolute: 2.1 10*3/uL (ref 0.7–3.1)
Lymphs: 30 %
MCH: 30.8 pg (ref 26.6–33.0)
MCHC: 34 g/dL (ref 31.5–35.7)
MCV: 91 fL (ref 79–97)
Monocytes Absolute: 0.4 10*3/uL (ref 0.1–0.9)
Monocytes: 6 %
Neutrophils Absolute: 4.3 10*3/uL (ref 1.4–7.0)
Neutrophils: 62 %
Platelets: 324 10*3/uL (ref 150–450)
RBC: 4.42 x10E6/uL (ref 3.77–5.28)
RDW: 12.5 % (ref 11.7–15.4)
WBC: 6.9 10*3/uL (ref 3.4–10.8)

## 2022-04-28 ENCOUNTER — Ambulatory Visit (INDEPENDENT_AMBULATORY_CARE_PROVIDER_SITE_OTHER)
Admission: RE | Admit: 2022-04-28 | Discharge: 2022-04-28 | Disposition: A | Discharge status: Home / Self Care | Payer: No Typology Code available for payment source | Source: Ambulatory Visit | Attending: Physician Assistant | Admitting: Physician Assistant

## 2022-04-28 DIAGNOSIS — R1011 Right upper quadrant pain: Secondary | ICD-10-CM | POA: Diagnosis not present

## 2022-05-07 ENCOUNTER — Encounter: Payer: Self-pay | Admitting: Physician Assistant

## 2022-05-07 DIAGNOSIS — R0781 Pleurodynia: Secondary | ICD-10-CM

## 2022-05-13 ENCOUNTER — Other Ambulatory Visit: Payer: Self-pay | Admitting: Physician Assistant

## 2022-05-14 ENCOUNTER — Other Ambulatory Visit (HOSPITAL_COMMUNITY): Payer: Self-pay

## 2022-05-14 MED ORDER — WEGOVY 2.4 MG/0.75ML ~~LOC~~ SOAJ
2.4000 mg | SUBCUTANEOUS | 2 refills | Status: DC
  Filled 2022-05-14: qty 3, 28d supply, fill #0
  Filled 2022-06-16: qty 3, 28d supply, fill #1
  Filled 2022-07-08: qty 3, 28d supply, fill #2

## 2022-06-03 ENCOUNTER — Encounter: Payer: Self-pay | Admitting: Physician Assistant

## 2022-06-03 ENCOUNTER — Encounter: Payer: Self-pay | Admitting: Family Medicine

## 2022-07-15 ENCOUNTER — Other Ambulatory Visit (HOSPITAL_COMMUNITY): Payer: Self-pay

## 2022-07-15 ENCOUNTER — Encounter: Payer: Self-pay | Admitting: Physician Assistant

## 2022-07-15 MED ORDER — WEGOVY 2.4 MG/0.75ML ~~LOC~~ SOAJ
2.4000 mg | SUBCUTANEOUS | 0 refills | Status: DC
  Filled 2022-07-15: qty 3, 28d supply, fill #0

## 2022-07-17 ENCOUNTER — Other Ambulatory Visit (HOSPITAL_COMMUNITY): Payer: Self-pay

## 2022-07-28 ENCOUNTER — Other Ambulatory Visit (HOSPITAL_COMMUNITY): Payer: Self-pay

## 2022-07-29 ENCOUNTER — Other Ambulatory Visit (HOSPITAL_COMMUNITY): Payer: Self-pay

## 2022-08-04 ENCOUNTER — Other Ambulatory Visit (HOSPITAL_COMMUNITY): Payer: Self-pay

## 2022-08-04 NOTE — Telephone Encounter (Signed)
Called [employer] and spoke to [place] told her PA for [facility] 2.4 mg has been approved for one year. Deja verbalized understanding and will get Rx ready for pt.

## 2022-08-04 NOTE — Telephone Encounter (Signed)
PA for Wegovy 2.4 mg has been approved.The request has been approved. The authorization is effective from 08/03/2022 to 08/02/2023.

## 2022-09-02 ENCOUNTER — Other Ambulatory Visit (HOSPITAL_COMMUNITY): Payer: Self-pay

## 2022-09-02 ENCOUNTER — Other Ambulatory Visit: Payer: Self-pay | Admitting: Physician Assistant

## 2022-09-02 MED ORDER — WEGOVY 2.4 MG/0.75ML ~~LOC~~ SOAJ
2.4000 mg | SUBCUTANEOUS | 0 refills | Status: DC
  Filled 2022-09-02: qty 3, 28d supply, fill #0

## 2022-10-19 ENCOUNTER — Other Ambulatory Visit: Payer: Self-pay | Admitting: Physician Assistant

## 2022-10-19 DIAGNOSIS — F411 Generalized anxiety disorder: Secondary | ICD-10-CM

## 2022-10-20 ENCOUNTER — Encounter (HOSPITAL_COMMUNITY): Payer: Self-pay

## 2022-10-20 ENCOUNTER — Other Ambulatory Visit (HOSPITAL_COMMUNITY): Payer: Self-pay

## 2022-10-20 MED ORDER — CITALOPRAM HYDROBROMIDE 20 MG PO TABS
20.0000 mg | ORAL_TABLET | Freq: Every day | ORAL | 0 refills | Status: DC
  Filled 2022-10-20: qty 90, 90d supply, fill #0

## 2022-10-21 ENCOUNTER — Other Ambulatory Visit: Payer: Self-pay

## 2022-10-22 ENCOUNTER — Other Ambulatory Visit (HOSPITAL_COMMUNITY): Payer: Self-pay

## 2022-12-10 ENCOUNTER — Encounter: Payer: 59 | Admitting: Physician Assistant

## 2022-12-10 ENCOUNTER — Encounter (INDEPENDENT_AMBULATORY_CARE_PROVIDER_SITE_OTHER): Payer: Self-pay

## 2022-12-31 NOTE — Progress Notes (Signed)
Subjective:    Susan Craig is a 32 y.o. female and is here for a comprehensive physical exam.  HPI  Health Maintenance Due  Topic Date Due   DTaP/Tdap/Td (2 - Td or Tdap) 12/11/2022    Acute Concerns: None  Chronic Issues: Anxiety Treated with citalopram 20 mg daily. This is helping symptoms without negative side effects. Denies SI/HI.  Right Rib Pain She is still having this pain. Has not seen sports medicine yet. Treated with meloxicam 15 mg daily. Pain has remained constant.  Obesity Stopped Wegovy 2.4 mg weekly due to lack of insurance coverage. Discussed alternatives today.  Denies unusual headaches.  Health Maintenance: Immunizations -- Believes she had a tetanus vaccine in 2022 during pregnancy. Colonoscopy -- N/A Mammogram -- N/A PAP -- Negative on 05/29/21. Bone Density -- N/A Diet -- Healthy Exercise -- Has not been exercising often lately.  Sleep habits -- Stable. Does not believe she has sleep apnea. Mood -- Stable  UTD with dentist? - Yes UTD with eye doctor? - Not UTD.  Weight history: Wt Readings from Last 10 Encounters:  01/07/23 194 lb (88 kg)  04/25/22 213 lb 8 oz (96.8 kg)  10/31/21 218 lb (98.9 kg)  04/16/21 240 lb (108.9 kg)  09/14/20 213 lb 3.2 oz (96.7 kg)  06/10/19 182 lb 8 oz (82.8 kg)  03/05/18 205 lb (93 kg)  10/22/17 198 lb (89.8 kg)  09/28/17 204 lb 12 oz (92.9 kg)  10/01/16 183 lb 12.8 oz (83.4 kg)   Body mass index is 32.28 kg/m. No LMP recorded.  Alcohol use:  reports current alcohol use of about 2.0 standard drinks of alcohol per week.  Tobacco use:  Tobacco Use: Low Risk  (01/07/2023)   Patient History    Smoking Tobacco Use: Never    Smokeless Tobacco Use: Never    Passive Exposure: Not on file   Eligible for lung cancer screening? No     01/07/2023    2:56 PM  Depression screen PHQ 2/9  Decreased Interest 0  Down, Depressed, Hopeless 0  PHQ - 2 Score 0     Other  providers/specialists: Patient Care Team: Jarold Motto, [place] as PCP - General (Physician Assistant) Olivia Mackie, MD as Consulting Physician (Obstetrics and Gynecology) Dimitri Ped, MD as Consulting Physician (Surgery)    PMHx, SurgHx, SocialHx, Medications, and Allergies were reviewed in the Visit Navigator and updated as appropriate.   Past Medical History:  Diagnosis Date   Allergy    seasonal   Anxiety    Chicken pox    PONV (postoperative nausea and vomiting)      Past Surgical History:  Procedure Laterality Date   COSMETIC SURGERY     nasal surgeryx3 + septoplasty x 2(due to injury)   NOSE SURGERY     septoplasty x2 and revision x1--- [facility]   TONSILLECTOMY  05/13/2007     Family History  Problem Relation Age of Onset   Anxiety disorder Mother    Hyperlipidemia Mother    Obesity Mother    Depression Father    Obesity Father    Anxiety disorder Sister    Lung cancer Maternal Grandmother        Smoker   COPD Maternal Grandfather    Diabetes Maternal Grandfather    Arthritis Paternal Grandmother    Depression Paternal Grandmother    Pancreatic cancer Paternal Grandmother    High blood pressure Paternal Grandfather    Heart disease Paternal Grandfather  a fib-- cabg   Lung cancer Maternal Uncle    Colon cancer Neg Hx     Social History   Tobacco Use   Smoking status: Never   Smokeless tobacco: Never  Substance Use Topics   Alcohol use: Yes    Alcohol/week: 2.0 standard drinks of alcohol    Types: 2 Glasses of wine per week    Comment: Occasionally   Drug use: No    Review of Systems:   Review of Systems  Constitutional:  Negative for chills, fever, malaise/fatigue and weight loss.  HENT:  Negative for hearing loss, sinus pain and sore throat.   Respiratory:  Negative for cough, hemoptysis and shortness of breath.   Cardiovascular:  Negative for chest pain, palpitations, leg swelling and PND.  Gastrointestinal:  Negative  for abdominal pain, constipation, diarrhea, heartburn, nausea and vomiting.  Genitourinary:  Negative for dysuria, frequency and urgency.  Musculoskeletal:  Negative for back pain, myalgias and neck pain.       (+) Right rib pain  Skin:  Negative for itching and rash.  Neurological:  Negative for dizziness, tingling, seizures and headaches.  Endo/Heme/Allergies:  Negative for polydipsia.  Psychiatric/Behavioral:  Negative for depression. The patient is not nervous/anxious.     Objective:   BP 95/60   Pulse 70   Temp 97.7 F (36.5 C) (Temporal)   Ht 5\' 5"  (1.651 m)   Wt 194 lb (88 kg)   SpO2 97%   BMI 32.28 kg/m  Body mass index is 32.28 kg/m.   General Appearance:    Alert, cooperative, no distress, appears stated age  Head:    Normocephalic, without obvious abnormality, atraumatic  Eyes:    PERRL, conjunctiva/corneas clear, EOM's intact, fundi    benign, both eyes  Ears:    Normal TM's and external ear canals, both ears  Nose:   Nares normal, septum midline, mucosa normal, no drainage    or sinus tenderness  Throat:   Lips, mucosa, and tongue normal; teeth and gums normal  Neck:   Supple, symmetrical, trachea midline, no adenopathy;    thyroid:  no enlargement/tenderness/nodules; no carotid   bruit or JVD  Back:     Symmetric, no curvature, ROM normal, no CVA tenderness  Lungs:     Clear to auscultation bilaterally, respirations unlabored  Chest Wall:    No tenderness or deformity   Heart:    Regular rate and rhythm, S1 and S2 normal, no murmur, rub or gallop  Breast Exam:    Deferred   Abdomen:     Soft, non-tender, bowel sounds active all four quadrants,    no masses, no organomegaly  Genitalia:    Deferred   Extremities:   Extremities normal, atraumatic, no cyanosis or edema  Pulses:   2+ and symmetric all extremities  Skin:   Skin color, texture, turgor normal, no rashes or lesions  Lymph nodes:   Cervical, supraclavicular, and axillary nodes normal  Neurologic:    CNII-XII intact, normal strength, sensation and reflexes    throughout    Assessment/Plan:   Routine physical examination Today patient counseled on age appropriate routine health concerns for screening and prevention, each reviewed and up to date or declined. Immunizations reviewed and up to date or declined. Labs ordered and reviewed. Risk factors for depression reviewed and negative. Hearing function and visual acuity are intact. ADLs screened and addressed as needed. Functional ability and level of safety reviewed and appropriate. Education, counseling and referrals performed based  on assessed risks today. Patient provided with a copy of personalized plan for preventive services.  Generalized anxiety disorder Well controlled Continue celexa 20 mg daily Follow-up in 1 year(s), sooner if concerns  Obesity (BMI 30-39.9) Continue efforts at healthy lifestyle  Hyperlipidemia, unspecified hyperlipidemia type Update lipid panel and make recommendations accordingly  Rib pain Due to persistence of symptom(s), recommend evaluation with sports medicine -- she will consider this   I,Alexander Ruley,acting as a scribe for [employer], PA.,have documented all relevant documentation on the behalf of Jarold Motto, PA,as directed by  Jarold Motto, PA while in the presence of Jarold Motto, [place].   I, Jarold Motto, [place], have reviewed all documentation for this visit. The documentation on 01/07/23 for the exam, diagnosis, procedures, and orders are all accurate and complete.    Jarold Motto, PA-C [facility]

## 2023-01-07 ENCOUNTER — Encounter: Payer: Self-pay | Admitting: Physician Assistant

## 2023-01-07 ENCOUNTER — Ambulatory Visit (INDEPENDENT_AMBULATORY_CARE_PROVIDER_SITE_OTHER): Payer: 59 | Admitting: Physician Assistant

## 2023-01-07 VITALS — BP 95/60 | HR 70 | Temp 97.7°F | Ht 65.0 in | Wt 194.0 lb

## 2023-01-07 DIAGNOSIS — Z6832 Body mass index (BMI) 32.0-32.9, adult: Secondary | ICD-10-CM | POA: Diagnosis not present

## 2023-01-07 DIAGNOSIS — E785 Hyperlipidemia, unspecified: Secondary | ICD-10-CM

## 2023-01-07 DIAGNOSIS — R0781 Pleurodynia: Secondary | ICD-10-CM

## 2023-01-07 DIAGNOSIS — E669 Obesity, unspecified: Secondary | ICD-10-CM

## 2023-01-07 DIAGNOSIS — Z0000 Encounter for general adult medical examination without abnormal findings: Secondary | ICD-10-CM | POA: Diagnosis not present

## 2023-01-07 DIAGNOSIS — F411 Generalized anxiety disorder: Secondary | ICD-10-CM

## 2023-01-07 NOTE — Patient Instructions (Signed)
It was great to see you! ? ?Please go to the lab for blood work.  ? ?Our office will call you with your results unless you have chosen to receive results via MyChart. ? ?If your blood work is normal we will follow-up each year for physicals and as scheduled for chronic medical problems. ? ?If anything is abnormal we will treat accordingly and get you in for a follow-up. ? ?Take care, ? ?Samantha ?  ? ? ?

## 2023-01-08 LAB — LIPID PANEL
Cholesterol: 206 mg/dL — ABNORMAL HIGH (ref 0–200)
HDL: 60 mg/dL (ref >39.00)
LDL Cholesterol: 131 mg/dL — ABNORMAL HIGH (ref 0–99)
NonHDL: 146.18
Total CHOL/HDL Ratio: 3
Triglycerides: 76 mg/dL (ref 0.0–149.0)
VLDL: 15.2 mg/dL (ref 0.0–40.0)

## 2023-01-08 LAB — CBC WITH DIFFERENTIAL/PLATELET
Basophils Absolute: 0.1 10*3/uL (ref 0.0–0.1)
Basophils Relative: 0.9 % (ref 0.0–3.0)
Eosinophils Absolute: 0.1 10*3/uL (ref 0.0–0.7)
Eosinophils Relative: 0.5 % (ref 0.0–5.0)
HCT: 40.9 % (ref 36.0–46.0)
Hemoglobin: 13.4 g/dL (ref 12.0–15.0)
Lymphocytes Relative: 27.3 % (ref 12.0–46.0)
Lymphs Abs: 3.2 10*3/uL (ref 0.7–4.0)
MCHC: 32.7 g/dL (ref 30.0–36.0)
MCV: 92.9 fl (ref 78.0–100.0)
Monocytes Absolute: 0.5 10*3/uL (ref 0.1–1.0)
Monocytes Relative: 4.4 % (ref 3.0–12.0)
Neutro Abs: 7.7 10*3/uL (ref 1.4–7.7)
Neutrophils Relative %: 66.9 % (ref 43.0–77.0)
Platelets: 356 10*3/uL (ref 150.0–400.0)
RBC: 4.4 Mil/uL (ref 3.87–5.11)
RDW: 13.3 % (ref 11.5–15.5)
WBC: 11.6 10*3/uL — ABNORMAL HIGH (ref 4.0–10.5)

## 2023-01-08 LAB — COMPREHENSIVE METABOLIC PANEL
ALT: 16 U/L (ref 0–35)
AST: 20 U/L (ref 0–37)
Albumin: 4.6 g/dL (ref 3.5–5.2)
Alkaline Phosphatase: 40 U/L (ref 39–117)
BUN: 18 mg/dL (ref 6–23)
CO2: 22 meq/L (ref 19–32)
Calcium: 9.9 mg/dL (ref 8.4–10.5)
Chloride: 102 meq/L (ref 96–112)
Creatinine, Ser: 0.77 mg/dL (ref 0.40–1.20)
GFR: 101.9 mL/min (ref >60.00)
Glucose, Bld: 68 mg/dL — ABNORMAL LOW (ref 70–99)
Potassium: 4 meq/L (ref 3.5–5.1)
Sodium: 135 meq/L (ref 135–145)
Total Bilirubin: 0.5 mg/dL (ref 0.2–1.2)
Total Protein: 7.5 g/dL (ref 6.0–8.3)

## 2023-01-09 ENCOUNTER — Other Ambulatory Visit: Payer: Self-pay | Admitting: Physician Assistant

## 2023-01-09 DIAGNOSIS — D72829 Elevated white blood cell count, unspecified: Secondary | ICD-10-CM

## 2023-01-20 ENCOUNTER — Other Ambulatory Visit (HOSPITAL_COMMUNITY): Payer: Self-pay

## 2023-01-20 ENCOUNTER — Other Ambulatory Visit: Payer: Self-pay | Admitting: Physician Assistant

## 2023-01-20 ENCOUNTER — Other Ambulatory Visit: Payer: Self-pay

## 2023-01-20 DIAGNOSIS — F411 Generalized anxiety disorder: Secondary | ICD-10-CM

## 2023-01-20 MED ORDER — CITALOPRAM HYDROBROMIDE 20 MG PO TABS
20.0000 mg | ORAL_TABLET | Freq: Every day | ORAL | 0 refills | Status: DC
  Filled 2023-01-20: qty 90, 90d supply, fill #0

## 2023-04-04 ENCOUNTER — Telehealth: Payer: 59 | Admitting: Nurse Practitioner

## 2023-04-04 DIAGNOSIS — J069 Acute upper respiratory infection, unspecified: Secondary | ICD-10-CM

## 2023-04-04 MED ORDER — PROMETHAZINE-DM 6.25-15 MG/5ML PO SYRP: 5.0000 mL | ORAL_SOLUTION | Freq: Four times a day (QID) | ORAL | 0 refills | Status: DC | PRN

## 2023-04-04 MED ORDER — FLUTICASONE PROPIONATE 50 MCG/ACT NA SUSP: 2.0000 | Freq: Every day | NASAL | 0 refills | Status: DC

## 2023-04-04 MED ORDER — BENZONATATE 200 MG PO CAPS: 200.0000 mg | ORAL_CAPSULE | Freq: Two times a day (BID) | ORAL | 0 refills | Status: DC | PRN

## 2023-04-04 NOTE — Progress Notes (Signed)
I have spent 5 minutes in review of e-visit questionnaire, review and updating patient chart, medical decision making and response to patient.  ° °Jerrell Mangel W Secilia Apps, NP ° °  °

## 2023-04-04 NOTE — Progress Notes (Signed)
E-Visit for Upper Respiratory Infection   We are sorry you are not feeling well.  Here is how we plan to help!  Providers prescribe antibiotics to treat infections caused by bacteria. Antibiotics are very powerful in treating bacterial infections when they are used properly. To maintain their effectiveness, they should be used only when necessary. Overuse of antibiotics has resulted in the development of superbugs that are resistant to treatment!    After careful review of your answers, I would not recommend an antibiotic for your condition.  Antibiotics are not effective against viruses and therefore should not be used to treat them. Common examples of infections caused by viruses include colds and flu   Based on what you have shared with me, it looks like you may have a viral upper respiratory infection.  Upper respiratory infections are caused by a large number of viruses; however, rhinovirus is the most common cause.   Symptoms vary from person to person, with common symptoms including sore throat, cough, fatigue or lack of energy and feeling of general discomfort.  A low-grade fever of up to 100.4 may present, but is often uncommon.  Symptoms vary however, and are closely related to a person's age or underlying illnesses.  The most common symptoms associated with an upper respiratory infection are nasal discharge or congestion, cough, sneezing, headache and pressure in the ears and face.  These symptoms usually persist for about 3 to 10 days, but can last up to 2 weeks.  It is important to know that upper respiratory infections do not cause serious illness or complications in most cases.    Upper respiratory infections can be transmitted from person to person, with the most common method of transmission being a person's hands.  The virus is able to live on the skin and can infect other persons for up to 2 hours after direct contact.  Also, these can be transmitted when someone coughs or sneezes;  thus, it is important to cover the mouth to reduce this risk.  To keep the spread of the illness at bay, good hand hygiene is very important.  This is an infection that is most likely caused by a virus. There are no specific treatments other than to help you with the symptoms until the infection runs its course.  We are sorry you are not feeling well.  Here is how we plan to help!   For nasal congestion, you may use an oral decongestants such as Mucinex D or if you have glaucoma or high blood pressure use plain Mucinex.  Saline nasal spray or nasal drops can help and can safely be used as often as needed for congestion.  For your congestion, I have prescribed Fluticasone nasal spray one spray in each nostril twice a day  If you do not have a history of heart disease, hypertension, diabetes or thyroid disease, prostate/bladder issues or glaucoma, you may also use Sudafed to treat nasal congestion.  It is highly recommended that you consult with a pharmacist or your primary care physician to ensure this medication is safe for you to take.     If you have a cough, you may use cough suppressants such as Delsym and Robitussin.  If you have glaucoma or high blood pressure, you can also use Coricidin HBP.   For cough I have prescribed for you A prescription cough medication called Tessalon Perles 200mg . You may take 1-2 capsules every 8 hours as needed for cough AND a nighttime cough syrup to  help with cough at night. If you are not feeling better in 5  days you can reach back out to [place] for additional treatment.   If you have a sore or scratchy throat, use a saltwater gargle-  to  teaspoon of salt dissolved in a 4-ounce to 8-ounce glass of warm water.  Gargle the solution for approximately 15-30 seconds and then spit.  It is important not to swallow the solution.  You can also use throat lozenges/cough drops and Chloraseptic spray to help with throat pain or discomfort.  Warm or cold liquids can also be  helpful in relieving throat pain.  For headache, pain or general discomfort, you can use Ibuprofen or Tylenol as directed.   Some authorities believe that zinc sprays or the use of Echinacea may shorten the course of your symptoms.   HOME CARE Only take medications as instructed by your medical team. Be sure to drink plenty of fluids. Water is fine as well as fruit juices, sodas and electrolyte beverages. You may want to stay away from caffeine or alcohol. If you are nauseated, try taking small sips of liquids. How do you know if you are getting enough fluid? Your urine should be a pale yellow or almost colorless. Get rest. Taking a steamy shower or using a humidifier may help nasal congestion and ease sore throat pain. You can place a towel over your head and breathe in the steam from hot water coming from a faucet. Using a saline nasal spray works much the same way. Cough drops, hard candies and sore throat lozenges may ease your cough. Avoid close contacts especially the very young and the elderly Cover your mouth if you cough or sneeze Always remember to wash your hands.   GET HELP RIGHT AWAY IF: You develop worsening fever. If your symptoms do not improve within 10 days You develop yellow or green discharge from your nose over 3 days. You have coughing fits You develop a severe head ache or visual changes. You develop shortness of breath, difficulty breathing or start having chest pain Your symptoms persist after you have completed your treatment plan  MAKE SURE YOU  Understand these instructions. Will watch your condition. Will get help right away if you are not doing well or get worse.  Thank you for choosing an e-visit.  Your e-visit answers were reviewed by a board certified advanced clinical practitioner to complete your personal care plan. Depending upon the condition, your plan could have included both over the counter or prescription medications.  Please review your  pharmacy choice. Make sure the pharmacy is open so you can pick up prescription now. If there is a problem, you may contact your provider through [employer] and have the prescription routed to another pharmacy.  Your safety is important to [place]. If you have drug allergies check your prescription carefully.   For the next 24 hours you can use MyChart to ask questions about today's visit, request a non-urgent call back, or ask for a work or school excuse. You will get an email in the next two days asking about your experience. I hope that your e-visit has been valuable and will speed your recovery.

## 2023-04-08 ENCOUNTER — Telehealth: Payer: 59 | Admitting: Physician Assistant

## 2023-04-08 DIAGNOSIS — B9689 Other specified bacterial agents as the cause of diseases classified elsewhere: Secondary | ICD-10-CM

## 2023-04-08 MED ORDER — AZITHROMYCIN 250 MG PO TABS: ORAL_TABLET | ORAL | 0 refills | Status: AC

## 2023-04-08 NOTE — Progress Notes (Signed)
I have spent 5 minutes in review of e-visit questionnaire, review and updating patient chart, medical decision making and response to patient.   Mia Milan Cody Jacklynn Dehaas, PA-C    

## 2023-04-08 NOTE — Progress Notes (Signed)
E-Visit for Cough   We are sorry that you are not feeling well.  Here is how we plan to help!  Based on your presentation I believe you most likely have A cough due to bacteria.  When patients have a fever and a productive cough with a change in color or increased sputum production, we are concerned about bacterial bronchitis.  If left untreated it can progress to pneumonia.  If your symptoms do not improve with your treatment plan it is important that you contact your provider.   I have prescribed Azithromyin 250 mg: two tablets now and then one tablet daily for 4 additonal days    In addition you may use A non-prescription cough medication called Mucinex DM: take 2 tablets every 12 hours. Continue your prescription cough medication along with this.   From your responses in the eVisit questionnaire you describe inflammation in the upper respiratory tract which is causing a significant cough.  This is commonly called Bronchitis and has four common causes:   Allergies Viral Infections Acid Reflux Bacterial Infection Allergies, viruses and acid reflux are treated by controlling symptoms or eliminating the cause. An example might be a cough caused by taking certain blood pressure medications. You stop the cough by changing the medication. Another example might be a cough caused by acid reflux. Controlling the reflux helps control the cough.  USE OF BRONCHODILATOR ("RESCUE") INHALERS: There is a risk from using your bronchodilator too frequently.  The risk is that over-reliance on a medication which only relaxes the muscles surrounding the breathing tubes can reduce the effectiveness of medications prescribed to reduce swelling and congestion of the tubes themselves.  Although you feel brief relief from the bronchodilator inhaler, your asthma may actually be worsening with the tubes becoming more swollen and filled with mucus.  This can delay other crucial treatments, such as oral steroid medications.  If you need to use a bronchodilator inhaler daily, several times per day, you should discuss this with your provider.  There are probably better treatments that could be used to keep your asthma under control.     HOME CARE Only take medications as instructed by your medical team. Complete the entire course of an antibiotic. Drink plenty of fluids and get plenty of rest. Avoid close contacts especially the very young and the elderly Cover your mouth if you cough or cough into your sleeve. Always remember to wash your hands A steam or ultrasonic humidifier can help congestion.   GET HELP RIGHT AWAY IF: You develop worsening fever. You become short of breath You cough up blood. Your symptoms persist after you have completed your treatment plan MAKE SURE YOU  Understand these instructions. Will watch your condition. Will get help right away if you are not doing well or get worse.    Thank you for choosing an e-visit.  Your e-visit answers were reviewed by a board certified advanced clinical practitioner to complete your personal care plan. Depending upon the condition, your plan could have included both over the counter or prescription medications.  Please review your pharmacy choice. Make sure the pharmacy is open so you can pick up prescription now. If there is a problem, you may contact your provider through [employer] and have the prescription routed to another pharmacy.  Your safety is important to [place]. If you have drug allergies check your prescription carefully.   For the next 24 hours you can use MyChart to ask questions about today's visit, request a  non-urgent call back, or ask for a work or school excuse. You will get an email in the next two days asking about your experience. I hope that your e-visit has been valuable and will speed your recovery.

## 2023-04-09 ENCOUNTER — Other Ambulatory Visit: Payer: Self-pay | Admitting: Nurse Practitioner

## 2023-04-09 DIAGNOSIS — J069 Acute upper respiratory infection, unspecified: Secondary | ICD-10-CM

## 2023-04-21 ENCOUNTER — Other Ambulatory Visit: Payer: Self-pay | Admitting: Physician Assistant

## 2023-04-21 DIAGNOSIS — F411 Generalized anxiety disorder: Secondary | ICD-10-CM

## 2023-04-22 ENCOUNTER — Other Ambulatory Visit: Payer: Self-pay

## 2023-04-22 MED ORDER — CITALOPRAM HYDROBROMIDE 20 MG PO TABS
20.0000 mg | ORAL_TABLET | Freq: Every day | ORAL | 0 refills | Status: DC
  Filled 2023-04-22: qty 90, 90d supply, fill #0

## 2023-06-03 ENCOUNTER — Other Ambulatory Visit (HOSPITAL_COMMUNITY): Payer: Self-pay

## 2023-07-10 DIAGNOSIS — H5212 Myopia, left eye: Secondary | ICD-10-CM | POA: Diagnosis not present

## 2023-07-20 ENCOUNTER — Other Ambulatory Visit: Payer: Self-pay | Admitting: Physician Assistant

## 2023-07-20 DIAGNOSIS — F411 Generalized anxiety disorder: Secondary | ICD-10-CM

## 2023-07-21 ENCOUNTER — Other Ambulatory Visit: Payer: Self-pay

## 2023-07-21 ENCOUNTER — Other Ambulatory Visit (HOSPITAL_COMMUNITY): Payer: Self-pay

## 2023-07-21 MED ORDER — CITALOPRAM HYDROBROMIDE 20 MG PO TABS
20.0000 mg | ORAL_TABLET | Freq: Every day | ORAL | 0 refills | Status: DC
  Filled 2023-07-21: qty 90, 90d supply, fill #0

## 2023-09-22 DIAGNOSIS — Z01411 Encounter for gynecological examination (general) (routine) with abnormal findings: Secondary | ICD-10-CM | POA: Diagnosis not present

## 2023-09-22 DIAGNOSIS — Z1331 Encounter for screening for depression: Secondary | ICD-10-CM | POA: Diagnosis not present

## 2023-09-22 DIAGNOSIS — Z01419 Encounter for gynecological examination (general) (routine) without abnormal findings: Secondary | ICD-10-CM | POA: Diagnosis not present

## 2023-09-22 DIAGNOSIS — Z113 Encounter for screening for infections with a predominantly sexual mode of transmission: Secondary | ICD-10-CM | POA: Diagnosis not present

## 2023-09-22 DIAGNOSIS — Z124 Encounter for screening for malignant neoplasm of cervix: Secondary | ICD-10-CM | POA: Diagnosis not present

## 2023-10-15 ENCOUNTER — Other Ambulatory Visit: Payer: Self-pay | Admitting: Physician Assistant

## 2023-10-15 DIAGNOSIS — F411 Generalized anxiety disorder: Secondary | ICD-10-CM

## 2023-10-16 ENCOUNTER — Other Ambulatory Visit: Payer: Self-pay

## 2023-10-16 ENCOUNTER — Encounter: Payer: Self-pay | Admitting: Pharmacist

## 2023-10-16 MED ORDER — CITALOPRAM HYDROBROMIDE 20 MG PO TABS
20.0000 mg | ORAL_TABLET | Freq: Every day | ORAL | 0 refills | Status: DC
  Filled 2023-10-16 – 2023-10-21 (×2): qty 90, 90d supply, fill #0

## 2023-10-21 ENCOUNTER — Other Ambulatory Visit (HOSPITAL_COMMUNITY): Payer: Self-pay

## 2023-10-21 ENCOUNTER — Other Ambulatory Visit: Payer: Self-pay

## 2024-01-14 ENCOUNTER — Encounter: Payer: Self-pay | Admitting: Physician Assistant

## 2024-01-14 ENCOUNTER — Other Ambulatory Visit: Payer: Self-pay | Admitting: Physician Assistant

## 2024-01-14 ENCOUNTER — Other Ambulatory Visit (HOSPITAL_COMMUNITY): Payer: Self-pay

## 2024-01-14 ENCOUNTER — Encounter (HOSPITAL_COMMUNITY): Payer: Self-pay

## 2024-01-14 DIAGNOSIS — F411 Generalized anxiety disorder: Secondary | ICD-10-CM

## 2024-01-15 ENCOUNTER — Other Ambulatory Visit (HOSPITAL_COMMUNITY): Payer: Self-pay

## 2024-01-15 MED ORDER — CITALOPRAM HYDROBROMIDE 20 MG PO TABS: 20.0000 mg | ORAL_TABLET | Freq: Every day | ORAL | 0 refills | Status: DC

## 2024-01-15 MED ORDER — CITALOPRAM HYDROBROMIDE 20 MG PO TABS
20.0000 mg | ORAL_TABLET | Freq: Every day | ORAL | 0 refills | Status: DC
  Filled 2024-01-15 – 2024-01-21 (×2): qty 60, 60d supply, fill #0

## 2024-01-15 NOTE — Addendum Note (Signed)
 Addended by: THURMON ARLAND PARAS on: 01/15/2024 12:34 PM   Modules accepted: Orders

## 2024-01-21 ENCOUNTER — Other Ambulatory Visit (HOSPITAL_COMMUNITY): Payer: Self-pay

## 2024-01-21 ENCOUNTER — Other Ambulatory Visit: Payer: Self-pay

## 2024-03-01 ENCOUNTER — Encounter: Payer: Self-pay | Admitting: Physician Assistant

## 2024-03-01 ENCOUNTER — Ambulatory Visit: Admitting: Physician Assistant

## 2024-03-01 ENCOUNTER — Other Ambulatory Visit (HOSPITAL_COMMUNITY): Payer: Self-pay

## 2024-03-01 VITALS — BP 106/80 | HR 72 | Temp 98.1°F | Ht 65.0 in | Wt 219.0 lb

## 2024-03-01 DIAGNOSIS — E669 Obesity, unspecified: Secondary | ICD-10-CM | POA: Diagnosis not present

## 2024-03-01 DIAGNOSIS — F411 Generalized anxiety disorder: Secondary | ICD-10-CM | POA: Diagnosis not present

## 2024-03-01 DIAGNOSIS — Z0000 Encounter for general adult medical examination without abnormal findings: Secondary | ICD-10-CM | POA: Diagnosis not present

## 2024-03-01 DIAGNOSIS — E785 Hyperlipidemia, unspecified: Secondary | ICD-10-CM

## 2024-03-01 LAB — LIPID PANEL
Cholesterol: 197 mg/dL (ref 0–200)
HDL: 55.1 mg/dL (ref >39.00)
LDL Cholesterol: 121 mg/dL — ABNORMAL HIGH (ref 0–99)
NonHDL: 141.54
Total CHOL/HDL Ratio: 4
Triglycerides: 104 mg/dL (ref 0.0–149.0)
VLDL: 20.8 mg/dL (ref 0.0–40.0)

## 2024-03-01 LAB — CBC WITH DIFFERENTIAL/PLATELET
Basophils Absolute: 0.1 K/uL (ref 0.0–0.1)
Basophils Relative: 0.4 % (ref 0.0–3.0)
Eosinophils Absolute: 0 K/uL (ref 0.0–0.7)
Eosinophils Relative: 0.4 % (ref 0.0–5.0)
HCT: 41.6 % (ref 36.0–46.0)
Hemoglobin: 13.8 g/dL (ref 12.0–15.0)
Lymphocytes Relative: 17 % (ref 12.0–46.0)
Lymphs Abs: 1.9 K/uL (ref 0.7–4.0)
MCHC: 33.3 g/dL (ref 30.0–36.0)
MCV: 92 fl (ref 78.0–100.0)
Monocytes Absolute: 0.5 K/uL (ref 0.1–1.0)
Monocytes Relative: 4.2 % (ref 3.0–12.0)
Neutro Abs: 8.9 K/uL — ABNORMAL HIGH (ref 1.4–7.7)
Neutrophils Relative %: 78 % — ABNORMAL HIGH (ref 43.0–77.0)
Platelets: 333 K/uL (ref 150.0–400.0)
RBC: 4.52 Mil/uL (ref 3.87–5.11)
RDW: 12.4 % (ref 11.5–15.5)
WBC: 11.4 K/uL — ABNORMAL HIGH (ref 4.0–10.5)

## 2024-03-01 LAB — COMPREHENSIVE METABOLIC PANEL WITH GFR
ALT: 13 U/L (ref 0–35)
AST: 16 U/L (ref 0–37)
Albumin: 4.7 g/dL (ref 3.5–5.2)
Alkaline Phosphatase: 48 U/L (ref 39–117)
BUN: 15 mg/dL (ref 6–23)
CO2: 26 meq/L (ref 19–32)
Calcium: 9.4 mg/dL (ref 8.4–10.5)
Chloride: 102 meq/L (ref 96–112)
Creatinine, Ser: 0.85 mg/dL (ref 0.40–1.20)
GFR: 89.78 mL/min (ref >60.00)
Glucose, Bld: 94 mg/dL (ref 70–99)
Potassium: 3.9 meq/L (ref 3.5–5.1)
Sodium: 138 meq/L (ref 135–145)
Total Bilirubin: 0.4 mg/dL (ref 0.2–1.2)
Total Protein: 7.6 g/dL (ref 6.0–8.3)

## 2024-03-01 LAB — TSH: TSH: 1.34 u[IU]/mL (ref 0.35–5.50)

## 2024-03-01 MED ORDER — CITALOPRAM HYDROBROMIDE 20 MG PO TABS
20.0000 mg | ORAL_TABLET | Freq: Every day | ORAL | 3 refills | Status: AC
  Filled 2024-03-01 – 2024-03-16 (×2): qty 90, 90d supply, fill #0

## 2024-03-01 NOTE — Progress Notes (Signed)
 Subjective:    Susan Craig is a 33 y.o. female and is here for a comprehensive physical exam.  HPI  There are no preventive care reminders to display for this patient.  Discussed the use of AI scribe software for clinical note transcription with the patient, who gave verbal consent to proceed.  History of Present Illness   Susan Craig is a 33 year old female who presents for a routine follow-up visit.  She is taking Celexa  at a middle dose and finds it appropriate. She has started tirzepatide, with her third dose taken without side effects, and her weight remains stable. She is concerned about blood cholesterol levels due to her father's heart disease history. There is no chest pain, unexplained headaches, or leg swelling.  She experiences numbness in her hand during activities like driving or drying her hair, attributed to carpal tunnel syndrome from pregnancy. The numbness resolves when the activity stops. She has used braces in the past but not recently.  She sleeps well at night and has an upcoming dental appointment. She received her flu shot recently and felt better after a brief illness post-vaccination. Her family history includes lung cancer in her maternal grandmother and uncle, and pancreatic cancer in her paternal grandmother. She consumes alcohol rarely, does not smoke, and does not exercise regularly.        Health Maintenance: Immunizations -- UpToDate  Colonoscopy -- n/a Mammogram -- n/a PAP -- UpToDate  Bone Density -- n/a Diet -- reduced food noise with compounded tirzepatide  Exercise -- limited  Sleep habits -- no major concerns Mood -- overall doing well  UTD with dentist? - yes UTD with eye doctor? - yes  Weight history: Wt Readings from Last 10 Encounters:  03/01/24 219 lb (99.3 kg)  01/07/23 194 lb (88 kg)  04/25/22 213 lb 8 oz (96.8 kg)  10/31/21 218 lb (98.9 kg)  04/16/21 240 lb (108.9 kg)  09/14/20 213 lb 3.2 oz (96.7 kg)  06/10/19  182 lb 8 oz (82.8 kg)  03/05/18 205 lb (93 kg)  10/22/17 198 lb (89.8 kg)  09/28/17 204 lb 12 oz (92.9 kg)   Body mass index is 36.44 kg/m. Patient's last menstrual period was 02/02/2024 (approximate).  Alcohol use:  reports current alcohol use of about 2.0 standard drinks of alcohol per week.  Tobacco use:  Tobacco Use: Low Risk  (03/01/2024)   Patient History    Smoking Tobacco Use: Never    Smokeless Tobacco Use: Never    Passive Exposure: Not on file   Eligible for lung cancer screening? no     03/01/2024    8:17 AM  Depression screen PHQ 2/9  Decreased Interest 0  Down, Depressed, Hopeless 0  PHQ - 2 Score 0     Other providers/specialists: Patient Care Team: Job Lukes, [place] as PCP - General (Physician Assistant) Gorge Ade, MD as Consulting Physician (Obstetrics and Gynecology) Lelon Lonni BIRCH, MD as Consulting Physician (Surgery)    PMHx, SurgHx, SocialHx, Medications, and Allergies were reviewed in the Visit Navigator and updated as appropriate.   Past Medical History:  Diagnosis Date   Allergy    seasonal   Anxiety    Chicken pox    PONV (postoperative nausea and vomiting)      Past Surgical History:  Procedure Laterality Date   COSMETIC SURGERY     nasal surgeryx3 + septoplasty x 2(due to injury)   NOSE SURGERY     septoplasty x2 and  revision x1--- [facility]   TONSILLECTOMY  05/13/2007     Family History  Problem Relation Age of Onset   Anxiety disorder Mother    Hyperlipidemia Mother    Obesity Mother    Depression Father    Obesity Father    Anxiety disorder Sister    Obesity Sister    Lung cancer Maternal Grandmother        Smoker   Obesity Maternal Grandmother    COPD Maternal Grandfather    Diabetes Maternal Grandfather    Obesity Maternal Grandfather    Arthritis Paternal Grandmother    Depression Paternal Grandmother    Pancreatic cancer Paternal Grandmother    High blood pressure Paternal Grandfather     Heart disease Paternal Grandfather        a fib-- cabg   Obesity Paternal Grandfather    Lung cancer Maternal Uncle    Colon cancer Neg Hx     Social History   Tobacco Use   Smoking status: Never   Smokeless tobacco: Never  Substance Use Topics   Alcohol use: Yes    Alcohol/week: 2.0 standard drinks of alcohol    Types: 2 Glasses of wine per week    Comment: Occasionally   Drug use: No    Review of Systems:   Review of Systems  Constitutional:  Negative for chills, fever, malaise/fatigue and weight loss.  HENT:  Negative for hearing loss, sinus pain and sore throat.   Respiratory:  Negative for cough and hemoptysis.   Cardiovascular:  Negative for chest pain, palpitations, leg swelling and PND.  Gastrointestinal:  Negative for abdominal pain, constipation, diarrhea, heartburn, nausea and vomiting.  Genitourinary:  Negative for dysuria, frequency and urgency.  Musculoskeletal:  Negative for back pain, myalgias and neck pain.  Skin:  Negative for itching and rash.  Neurological:  Negative for dizziness, tingling, seizures and headaches.  Endo/Heme/Allergies:  Negative for polydipsia.  Psychiatric/Behavioral:  Negative for depression. The patient is not nervous/anxious.     Objective:   BP 106/80 (BP Location: Left Arm, Patient Position: Sitting, Cuff Size: Large)   Pulse 72   Temp 98.1 F (36.7 C) (Temporal)   Ht 5' 5 (1.651 m)   Wt 219 lb (99.3 kg)   LMP 02/02/2024 (Approximate)   SpO2 99%   BMI 36.44 kg/m  Body mass index is 36.44 kg/m.   General Appearance:    Alert, cooperative, no distress, appears stated age  Head:    Normocephalic, without obvious abnormality, atraumatic  Eyes:    PERRL, conjunctiva/corneas clear, EOM's intact, fundi    benign, both eyes  Ears:    Normal TM's and external ear canals, both ears  Nose:   Nares normal, septum midline, mucosa normal, no drainage    or sinus tenderness  Throat:   Lips, mucosa, and tongue normal; teeth and  gums normal  Neck:   Supple, symmetrical, trachea midline, no adenopathy;    thyroid :  no enlargement/tenderness/nodules; no carotid   bruit or JVD  Back:     Symmetric, no curvature, ROM normal, no CVA tenderness  Lungs:     Clear to auscultation bilaterally, respirations unlabored  Chest Wall:    No tenderness or deformity   Heart:    Regular rate and rhythm, S1 and S2 normal, no murmur, rub or gallop  Breast Exam:    Deferred  Abdomen:     Soft, non-tender, bowel sounds active all four quadrants,    no masses, no organomegaly  Genitalia:    Deferred  Extremities:   Extremities normal, atraumatic, no cyanosis or edema  Pulses:   2+ and symmetric all extremities  Skin:   Skin color, texture, turgor normal, no rashes or lesions  Lymph nodes:   Cervical, supraclavicular, and axillary nodes normal  Neurologic:   CNII-XII intact, normal strength, sensation and reflexes    throughout    Assessment/Plan:   Assessment and Plan    Adult Wellness Visit Routine wellness visit with family history of cancer. No changes in lifestyle habits. Up to date with vaccinations and dental visits. - Order blood work including cholesterol levels.  Hyperlipidemia Concern for elevated cholesterol due to family history of heart disease. No cardiovascular symptoms. - Order blood work to check cholesterol levels.  Obesity Started tirzepatide with stable weight and no significant side effects. - Regular follow-up with prescriber for tirzepatide, including monthly updates and regular video consultations.  Generalized anxiety disorder Managed with Celexa  at a stable dose. No symptom changes. - Refill Celexa  for one year.  Carpal tunnel syndrome, right hand Persistent symptoms with numbness during activities. Symptoms less severe than during pregnancy. No current brace use. - Consider use of wrist brace for symptom management.           Lucie Buttner, PA-C [facility]

## 2024-03-02 ENCOUNTER — Other Ambulatory Visit: Payer: Self-pay | Admitting: Physician Assistant

## 2024-03-02 ENCOUNTER — Encounter: Payer: Self-pay | Admitting: Physician Assistant

## 2024-03-02 ENCOUNTER — Ambulatory Visit: Payer: Self-pay | Admitting: Physician Assistant

## 2024-03-02 DIAGNOSIS — D72829 Elevated white blood cell count, unspecified: Secondary | ICD-10-CM

## 2024-03-16 ENCOUNTER — Other Ambulatory Visit: Payer: Self-pay

## 2024-03-16 ENCOUNTER — Other Ambulatory Visit (HOSPITAL_COMMUNITY): Payer: Self-pay

## 2024-04-19 ENCOUNTER — Encounter: Payer: Self-pay | Admitting: Physician Assistant

## 2024-04-20 ENCOUNTER — Encounter: Payer: Self-pay | Admitting: Physician Assistant

## 2024-04-20 ENCOUNTER — Other Ambulatory Visit: Payer: Self-pay | Admitting: Physician Assistant

## 2024-04-20 DIAGNOSIS — R0789 Other chest pain: Secondary | ICD-10-CM

## 2024-05-19 NOTE — Progress Notes (Signed)
"       ° °  LILLETTE Ileana Collet, PhD, LAT, ATC acting as a scribe for Artist Lloyd, MD.  Susan Craig is a 34 y.o. female who presents to [facility] at [facility] today for rib pain x a few year, started after her last pregnancy. Pt locates pain to R-side of her rib cage, more distal.   Aggravates: prolonged sitting Treatments tried: none  Pertinent review of systems: No fevers or chills  Relevant historical information: Pseudotumor cerebri. 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   Exam:  BP 124/84   Pulse 68   Ht 5' 5 (1.651 m)   Wt 216 lb (98 kg)   SpO2 99%   BMI 35.94 kg/m  General: Well Developed, well nourished, and in no acute distress.   MSK: Right rib: Normal appearing Not particularly tender to palpation.  Normal trunk motion.  Abdomen nontender to palpation.  No masses palpated.    Lab and Radiology Results  X-ray images right rib and chest obtained today personally and independently interpreted. No acute fractures.  No pneumothorax or pleural effusion.  No significant abnormalities on chest x-ray. Await formal radiology review     Assessment and Plan: 34 y.o. female with right rib pain this is a chronic ongoing issue.  Ergonomics at work are probably a factor.  She is on the phone a lot and does not have a headset.  Recommend using a headset.  Happily will write a letter or fill out forms if needed for this.  Additionally will refer to physical therapy. Recheck if not improved.  PDMP not reviewed this encounter. Orders Placed This Encounter  Procedures   DG Ribs Unilateral W/Chest Right    Standing Status:   Future    Number of Occurrences:   1    Expiration Date:   06/20/2024    Reason for Exam (SYMPTOM  OR DIAGNOSIS REQUIRED):   rib pain    Preferred imaging location?:   [facility]    Is patient pregnant?:   No   Ambulatory referral to Physical Therapy    Referral Priority:   Routine    Referral Type:    Physical Medicine    Referral Reason:   Specialty Services Required    Requested Specialty:   Physical Therapy    Number of Visits Requested:   1   No orders of the defined types were placed in this encounter.    Discussed warning signs or symptoms. Please see discharge instructions. Patient expresses understanding.   The above documentation has been reviewed and is accurate and complete Artist Lloyd, M.D.   "

## 2024-05-20 ENCOUNTER — Ambulatory Visit: Admitting: Family Medicine

## 2024-05-20 ENCOUNTER — Other Ambulatory Visit: Payer: Self-pay | Admitting: Physician Assistant

## 2024-05-20 ENCOUNTER — Ambulatory Visit

## 2024-05-20 VITALS — BP 124/84 | HR 68 | Ht 65.0 in | Wt 216.0 lb

## 2024-05-20 DIAGNOSIS — R0789 Other chest pain: Secondary | ICD-10-CM

## 2024-05-20 NOTE — Patient Instructions (Addendum)
 Thank you for coming in today.   Please get an Xray today before you leave   I've referred you to Physical Therapy.  Let us  know if you don't hear from them in one week.   Check back if not better

## 2024-05-21 LAB — CBC WITH DIFFERENTIAL/PLATELET
Basophils Absolute: 0 x10E3/uL (ref 0.0–0.2)
Basos: 0 %
EOS (ABSOLUTE): 0.1 x10E3/uL (ref 0.0–0.4)
Eos: 1 %
Hematocrit: 42.2 % (ref 34.0–46.6)
Hemoglobin: 13.2 g/dL (ref 11.1–15.9)
Immature Grans (Abs): 0 x10E3/uL (ref 0.0–0.1)
Immature Granulocytes: 0 %
Lymphocytes Absolute: 2.4 x10E3/uL (ref 0.7–3.1)
Lymphs: 28 %
MCH: 30.6 pg (ref 26.6–33.0)
MCHC: 31.3 g/dL — ABNORMAL LOW (ref 31.5–35.7)
MCV: 98 fL — ABNORMAL HIGH (ref 79–97)
Monocytes Absolute: 0.5 x10E3/uL (ref 0.1–0.9)
Monocytes: 5 %
Neutrophils Absolute: 5.8 x10E3/uL (ref 1.4–7.0)
Neutrophils: 66 %
Platelets: 371 x10E3/uL (ref 150–450)
RBC: 4.32 x10E6/uL (ref 3.77–5.28)
RDW: 12 % (ref 11.7–15.4)
WBC: 8.9 x10E3/uL (ref 3.4–10.8)

## 2024-05-22 ENCOUNTER — Encounter: Payer: Self-pay | Admitting: Physician Assistant

## 2024-05-27 ENCOUNTER — Ambulatory Visit: Payer: Self-pay | Admitting: Family Medicine

## 2024-05-27 NOTE — Progress Notes (Signed)
 Right rib x-ray looks okay to radiology.
